# Patient Record
Sex: Female | Born: 1950 | Race: White | Hispanic: No | Marital: Married | Smoking: Never smoker
Health system: Southern US, Community
[De-identification: ages and names within clinical notes are randomized; demographics above are authoritative.]

## PROBLEM LIST (undated history)

## (undated) DIAGNOSIS — I10 Essential (primary) hypertension: Secondary | ICD-10-CM

## (undated) DIAGNOSIS — M1990 Unspecified osteoarthritis, unspecified site: Secondary | ICD-10-CM

## (undated) HISTORY — PX: REPLACEMENT TOTAL KNEE: SUR1224

## (undated) HISTORY — PX: UMBILICAL HERNIA REPAIR: SHX196

## (undated) HISTORY — DX: Essential (primary) hypertension: I10

## (undated) HISTORY — DX: Unspecified osteoarthritis, unspecified site: M19.90

## (undated) HISTORY — PX: TUBAL LIGATION: SHX77

## (undated) HISTORY — PX: APPENDECTOMY: SHX54

---

## 2007-07-15 ENCOUNTER — Inpatient Hospital Stay (HOSPITAL_COMMUNITY): Admission: RE | Admit: 2007-07-15 | Discharge: 2007-07-23 | Payer: Self-pay | Admitting: Orthopedic Surgery

## 2007-07-15 ENCOUNTER — Ambulatory Visit: Payer: Self-pay | Admitting: Cardiology

## 2007-07-15 ENCOUNTER — Encounter (INDEPENDENT_AMBULATORY_CARE_PROVIDER_SITE_OTHER): Payer: Self-pay | Admitting: Orthopedic Surgery

## 2007-07-26 ENCOUNTER — Ambulatory Visit: Payer: Self-pay | Admitting: Surgery

## 2007-07-26 ENCOUNTER — Inpatient Hospital Stay (HOSPITAL_COMMUNITY): Admission: EM | Admit: 2007-07-26 | Discharge: 2007-07-29 | Payer: Self-pay | Admitting: Emergency Medicine

## 2007-07-26 ENCOUNTER — Ambulatory Visit: Payer: Self-pay | Admitting: Cardiology

## 2007-07-26 ENCOUNTER — Ambulatory Visit: Payer: Self-pay | Admitting: Oncology

## 2007-08-02 ENCOUNTER — Ambulatory Visit: Payer: Self-pay | Admitting: Oncology

## 2007-08-09 DIAGNOSIS — D473 Essential (hemorrhagic) thrombocythemia: Secondary | ICD-10-CM | POA: Insufficient documentation

## 2007-08-15 ENCOUNTER — Ambulatory Visit: Payer: Self-pay | Admitting: Cardiology

## 2007-10-28 ENCOUNTER — Ambulatory Visit: Payer: Self-pay | Admitting: Cardiology

## 2011-02-03 NOTE — Cardiovascular Report (Signed)
NAMEARBIE, REISZ                ACCOUNT NO.:  [id number]   MEDICAL RECORD NO.:  [id number]          PATIENT TYPE:  INP   LOCATION:  4712                         FACILITY:  [facility]   PHYSICIAN:  Arturo Morton. Riley Kill, MD, FACCDATE OF BIRTH:  18-Apr-1951   DATE OF PROCEDURE:  DATE OF DISCHARGE:                            CARDIAC CATHETERIZATION   INDICATIONS:  The patient is a 60 year old who presented with chest pain  after total knee replacement.  Dr. Antoine Poche felt that she did not have a  pulmonary embolus. A Myoview was obtained and suggested anterior  ischemia.  Based on this, cardiac catheterization was recommended.   PROCEDURE:  1. Left heart catheterization.  2. Selective coronary arteriography..  3. Selective left ventriculography.   DESCRIPTION OF PROCEDURE:  The patient was brought to the  catheterization laboratory, prepped and draped in usual fashion. Through  an anterior puncture the right femoral artery was easily entered.  A 5-  French sheath was placed.  Views of left and right coronaries were  obtained in multiple angiographic projections.  Central aortic and left  ventricular pressures measured with pigtail. Ventriculography was then  done in the RAO projection.  Then reviewed the pictures with the  patient. I also reviewed the pictures with the patient's family.  There  were no complications.  She was taken to the holding area in  satisfactory clinical condition for sheath removal.   HEMODYNAMIC DATA:  1. Central aortic pressure 174/92, mean 130.  2. Left ventricular pressure 158/15.  3. No gradient pullback across aortic valve.   ANGIOGRAPHIC DATA:  1. Ventriculography was done in the RAO projection.  There was      preserved overall LV function with ejection fraction of 50-60%.      There is perhaps some minor inferobasal, hypokinesis but it is      difficult to tell due to the ventricular ectopy.  2. The left main is free of critical disease.  3. The left  anterior descending artery courses to the apex.  There are      septal, and a small diagonal branch. All of these appear to be      without significant calcification and no critical obstruction.  4. The circumflex provides a small first marginal, very small second      and third marginals, and large third and fourth marginal branches.      The circumflex throughout is smooth, large in caliber, and free of      critical disease.  The size of the circumflex appears to be almost      up to 5 mm.  5. The right coronary artery provides a posterior descending branch      and a small insignificant posterolateral system and is free of      critical disease.   CONCLUSIONS:  1. Preserved overall LV function  2. No critical coronary stenoses.   A D-dimer was obtained at beginning of case and is currently pending.  I  have spoken with Dr. Antoine Poche who thinks that the likelihood of  pulmonary embolus is quite low.  She probably  will be ready for  discharge in the morning.      Arturo Morton. Riley Kill, MD, [facility]  Electronically Signed     TDS/MEDQ  D:  07/20/2007  T:  07/21/2007  Job:  [id number]   cc:   Antony Madura, M.D.  Rollene Rotunda, MD, Karleen Hampshire, M.D.

## 2011-02-03 NOTE — Op Note (Signed)
NAMEYENI, JIGGETTS                ACCOUNT NO.:  [id number]   MEDICAL RECORD NO.:  [id number]          PATIENT TYPE:  INP   LOCATION:  2899                         FACILITY:  [facility]   PHYSICIAN:  Dyke Brackett, M.D.    DATE OF BIRTH:  1950/11/02   DATE OF PROCEDURE:  07/15/2007  DATE OF DISCHARGE:                               OPERATIVE REPORT   PREOPERATIVE DIAGNOSIS:  Severe osteoarthritis, right knee, with varus  deformity.   POSTOPERATIVE DIAGNOSIS:  Severe osteoarthritis, right knee, with varus  deformity.   OPERATION:  Right total knee replacement (LCS rotating platform,  standard femur, standard patella with size 2.5 tibia and 12.5-mm  rotating platform).   SURGEON:  Dyke Brackett, M.D.   ASSISTANT:  Legrand Pitts. Duffy, P.A.   TOURNIQUET TIME:  Approximately 1 hour 5 minutes.   DESCRIPTION OF PROCEDURE:  After sterile prep and drape, exsanguination  of the leg and placed in the 375, straight skin incision, medial  parapatellar approach to the knee, made with stripping of the medial  side due to a varus deformity.  The cut was using the slotted guide in  the appropriate amount of valgus, cutting about 2.5 mm below the most  diseased medial compartment with a 7-degree posterior slope on the  tibial; this was followed by sizing the femur, cutting the anterior and  posterior femoral cut, flexion gap measured at 12.5 mm with cutting of  the anterior-posterior femoral cut and then 4-degree valgus distal  femoral cut.  Chamfer cuts were made with stripping of some posterior  osteophytes and capsule.  The PCL was released.  Menisci were resected.  The trial femur was essentially anatomic.  This was followed by cutting  the patella, leaving 13 mm of native patella.  A 3-button patella was  used and again, the tibial prepared with a keel cut for the tibia,  preceded by sizing a 2.5 and then the trial components were inserted  with excellent stability, slight hyperextension, good  stability in  flexion, no varus or valgus instability and no tendency for bearing  spinout.  Balance was judged to be excellent.   The final components were certain in the tibia followed by femur with a  trial bearing; this was allowed to harden.  Excess cement was removed,  the tourniquet was released and the posterior aspect of the knee showed  no excessive bleeding.  Capsule closure was with #1 Ethibond, 2-0 Vicryl  and skin clips.  Marcaine with epinephrine was infiltrated in the skin.  A lightly compressive sterile dressing was applied.  She was taken to  the recovery room in stable condition.      Dyke Brackett, M.D.  Electronically Signed     WDC/MEDQ  D:  07/15/2007  T:  07/16/2007  Job:  [id number]

## 2011-02-03 NOTE — Consult Note (Signed)
Laura [name], Laura [name]                ACCOUNT NO.:  [id number]   MEDICAL RECORD NO.:  [id number]          PATIENT TYPE:  INP   LOCATION:  5032                         FACILITY:  [facility]   PHYSICIAN:  Samul Dada, M.D.DATE OF BIRTH:  01/19/1951   DATE OF CONSULTATION:  07/26/2007  DATE OF DISCHARGE:                                 CONSULTATION   REASON FOR CONSULTATION:  Thrombocytosis and leukocytosis.   REFERRING PHYSICIAN:  [facility].   HISTORY OF PRESENT ILLNESS:  Ms. Prestridge is a pleasant, 60 year old  white female, asked to see for evaluation of elevated platelet count and  leukocytosis.  She has a recent right total knee placement, complicated  with cardiac issues, including palpitations, and undergoing cardiac  catheterization on October 29.  Her thrombocytosis can be traced back to  prior to her operation.  However, her white count at that time was  normal, being 8.6 on October 22, to have increased later after operation  to 14.1.  It remained steadily in the range of 12 to 14 until October  30, at which time increased to 16.6 on October 31, and jumped to 23.6  today.  Her platelets initially on October 22 669, but steadily has been  increasing until October 31 where they show a value of 827,000, and  today at 1019.  Her peripheral smear shows large platelets, myeloid and  erythroid immaturity.  Her red blood cells do not suggest iron  deficiency.  At this point, no labs are available from her [facility] doctor's office, but according to her, has had regular  labs, most recently on August and September of this year, and according  to her, no indication of elevated platelet count or white blood cells  was addressed as a concern.  She now presents to the emergency  department with right knee pain, right lower extremity pain, which is  described as deep, localized, worse with ambulation.  She also complains  of chest pain on ambulation.  She is being  evaluated by cardiology  group.  She is currently on Lovenox since her discharge from the  hospital, and on Coumadin x1 on June 24, 2007.   PAST MEDICAL HISTORY:  1. History of small PE per CT angio on July 20, 2007; ejection      fraction 40%.  2. Hypertension.  3. Osteoarthritis.  4. DJD.  5. Asthma.   SURGERIES:  1. Status post right TKR July 15, 2007; Dr. Madelon Lips.  2. Status post umbilical hernia repair in 2000.  3. Status post benign bulbar tumor resection.  4. Status post cardiac catheterization July 20 2007; Dr. Antoine Poche.   ALLERGIES:  PERCOCET, NAPROXEN, MORPHINE SULFATE, DARVOCET.   CURRENT MEDICATIONS:  1. Lotensin 20 mg q.d.  2. Colace 100 mg b.i.d.  3. Lasix 40 mg b.i.d.  4. Lopressor 25 mg b.i.d.  5. Robaxin 300 mg q.6 hours p.r.n.  6. Dilaudid and lidocaine p.r.n.   Of note, at home she is on chromium picolinate, calcium and vitamin C.   REVIEW OF SYSTEMS:  Remarkable for mild dyspnea on exertion,  and hot  flashes.  Please refer to HPI for significant positives, the rest of the  review of systems is negative.   FAMILY HISTORY:  Mother alive, with history of cancer of the breast,  lymphoma, as well as cervical cancer, and multiple skin lesions.  She is  under the care of Dr. Melvyn Neth in [place].  Father died with kidney  disease, CHF.  She has two sisters alive and well and three brothers in  good health.   SOCIAL HISTORY:  The patient is married for 33 years, three children.  She works at [employer] as a Scientist, physiological.  Never smoked.  No  alcohol history.  Lives in [place].  Her last colonoscopy was at age  29, normal.  Her last mammogram was in January of 2008, normal.   PHYSICAL EXAMINATION:  GENERAL:  This is a well-developed, obese 60-year-  old white female in no acute distress, alert and oriented x3.  VITAL SIGNS: Blood pressure 177/88, pulse 78, respirations 16, afebrile.  HEENT:  Normocephalic, atraumatic.  PERRLA.  Oral mucosa  is without  thrush or lesions.  No areas of bleeding.  NECK:  Supple.  No cervical or supraclavicular masses.  LUNGS:  Clear to auscultation bilaterally.  No axillary masses.  BREASTS:  Not examined.  CARDIOVASCULAR:  Regular rate and rhythm with 1/6 systolic murmur.  No  rubs or gallops.  ABDOMEN:  Obese, nontender.  Bowel sounds x4.  No palpable spleen or  liver.  GI/RECTAL:  Deferred.  EXTREMITIES:  No clubbing or cyanosis.  Right lower extremity is  edematous, shows a hematoma, hemarthrosis at the surgery area.  Staples  are present.  No oozing.  SKIN:  As mentioned above, she is showing postoperative changes in the  right lower extremity, otherwise no lesions are visible on the left.  NEURO:  Nonfocal.   LABS:  Hemoglobin 10.3, hematocrit 30.8, white count 23.6, platelets  1019, MCV 89.5.  PT 13.1, PTT 42, INR 1.0.  Neutrophils 19.5,  lymphocytes 2.0, monocytes 1.6.  TSH 2.103.  Sodium 134, potassium 3.4,  BUN 13, creatinine 0.46, glucose 131.  Total bilirubin 0.5, alkaline  phosphatase 88, AST 31, ALT 53, total protein 7.0, albumin 4.0, calcium  9.8.   RADIOLOGICAL STUDIES:  As of October 30, a CT angio of the chest shows a  small PE at the right middle and right upper lobe.  No mass or lesions.   CT of the chest today is negative.   Dopplers are negative.   ASSESSMENT/PLAN:  Dr. Arline Asp has seen and evaluated the patient and  the chart has been reviewed.  This is a 60 year old white female, asked  to see for evaluation of thrombocytosis and leukocytosis.  There are two  issues to be addressed:   1. Elevated white count and elevated platelet count.  The patient was      not aware of any abnormalities in the CBC prior to October 22 when      her white count was 8.6 and platelets were 669,000.  She is      followed by Dr. Karin Lieu at [facility] in [place],      and has had regular labs as recently as August or September of this      year.  The peripheral  smear shows large platelets, myeloid and      erythroid immaturity, no blasts.  Red blood cells does not suggest      iron deficiency.  The  spleen on the CT scan on October 30 does not      look enlarged.  These abnormalities could be secondary to a      myeloproliferative disorder or could be reactive, related to recent      surgery, known hematoma and possible knee infection, since she had      turbid synovial fluid with 16,000 white blood cells, mostly polys,      and possible iron deficiency.  Will be easier and more efficient to      evaluate this problem later after acute issues resolved.  Will      check her JAK2 mutation.  2. Pulmonary emboli of the right middle lobe and right upper lobe on      her CT chest angiogram on October 30, apparently resolved in two      days.  Repeat CT chest angio, with the patient being on Lovenox      since October 30.  Her PT is 13.1, INR 1.0.  However, the patient      now has a large hematoma at the right knee area after total knee      replacement.  The patient is at risk for recurrent pulmonary emboli      in this setting, is very high at least for the next several weeks.      Would; therefore, favor a temporary IVC filter as an alternative to      Lovenox and Coumadin.   Thank you very much for allowing [place] the opportunity to participate in  the care of Ms. Janeece Agee.  Pending on the results of the labs ordered,  further recommendations will follow.      Marlowe Kays, P.A.      Samul Dada, M.D.  Electronically Signed    SW/MEDQ  D:  07/28/2007  T:  07/28/2007  Job:  [id number]   cc:   Dr. Daleen Squibb  Dr. Karin Lieu

## 2011-02-03 NOTE — Consult Note (Signed)
NAMECONSUELA, WIDENER                ACCOUNT NO.:  [id number]   MEDICAL RECORD NO.:  [id number]          PATIENT TYPE:  INP   LOCATION:  6731                         FACILITY:  [facility]   PHYSICIAN:  Thomas C. Wall, MD, FACCDATE OF BIRTH:  03/17/1951   DATE OF CONSULTATION:  07/26/2007  DATE OF DISCHARGE:                                 CONSULTATION   PRIMARY CARDIOLOGIST:  Rollene Rotunda, MD, [facility].   REQUESTING PHYSICIAN:  Dyke Brackett, M.D., orthopedics.   PATIENT PROFILE:  A 60 year old Caucasian female with recent chest pain  evaluation including abnormal IV Myoview, normal cath, abnormal CT  revealing right upper and middle lobe pulmonary emboli, who presents to  the ED with right knee pain and right knee hematoma, who we are asked to  consult on regarding anticoagulation.   PROBLEM:  1. Right middle lobe and right upper lobe small pulmonary emboli by CT      July 21, 2007.  Repeat CT July 26, 2007 showing no evidence      of pulmonary emboli reviewed with radiology.  2. Normal coronary arteries.      a.     July 18, 2007 2-D echocardiogram:  EF 55-60%, mild MR,       mildly dilated LA,  small pericardial effusion.      b.     July 19, 2007 adenosine Myoview:  EF 48% with anterior       and apical lateral ischemia.      c.     July 20, 2007 cardiac catheterization:  Normal coronary       arteries, EF 50-60%.  3. Hypertension.  4. Degenerative joint disease/osteoarthritis.  July 15, 2007,      status post right total knee replacement.  5. Asthma.  6. Status post umbilical hernia repair in 2000.  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   HISTORY OF PRESENT ILLNESS:  A 60 year old Caucasian female with recent  chest pain evaluation following right total knee arthroplasty.  Chest  pain evaluation consisted of an echocardiogram which was normal and  followed by an abnormal adenosine Myoview as outlined in the problem  list and then subsequently a normal  cardiac catheterization.  CT of the  chest was then performed on July 21, 2007 revealing a small right  middle and upper lobe pulmonary emboli.  She was subsequently placed on  Lovenox as well as Coumadin and discharged home this past Saturday,  July 23, 2007.  Apparently she was not provided with a Coumadin  prescription immediately on discharge.  She did not take Coumadin over  the weekend, but did continue her Lovenox bridging.  She was seen by  home health yesterday and reinitiated on Coumadin yesterday.  She woke  up early this morning with significant posterior right knee pain which  was concerning for her and she subsequently presented to the [facility]  ED.  She noted in the ED that she was also somewhat short of breath.  Repeat CT of the chest showed no evidence of pulmonary emboli and this  has been reviewed with  radiology.  Ultrasound of the right lower  extremity was negative for DVT, but did show hematoma at the right knee  surgical site.  She has been admitted by Dr. Candise Bowens service and we  have been asked to assist with anticoagulation.  Also of note, she has  undergone CBC showing platelets of greater than 1,000,000 and white  count of 23,000.  Hematology has been consulted as well.  She currently  offers no cardiac complaints.  Denies any chest pain or shortness of  breath.   ALLERGIES:  1. MORPHINE causes nausea.  2. She is also allergic to DARVOCET.   HOME MEDICATIONS:  1. Benazepril/HCTZ 20/25 mg daily.  2. Black cohosh daily.  3. Calcium daily.  4. Dilaudid p.r.n.  5. Docusate sodium 100 mg b.i.d.  6. Lasix 40 mg b.i.d.  7. Claritin 10 mg daily.  8. Methocarbamol p.r.n.  9. Lopressor 25 mg b.i.d.  10.Percocet p.r.n.   FAMILY HISTORY:  Mother is alive at age 47 with history diabetes,  hypertension, hyperlipidemia and cancer.  Father died at 58 and had an  MI in his 36s.  She has 2 sisters and 3 brothers, some of them have  hypertension, but  otherwise they are all alive and well.   SOCIAL HISTORY:  She lives in [place] with her husband.  She has 2  children and 3 grandchildren.  She has been married for 33 years.  She  denies any tobacco, alcohol or drug use.  Currently  not exercising.   REVIEW OF SYSTEMS:  Positive for shortness of breath this morning.  She  has right knee swelling and pain.  Otherwise all systems reviewed and  negative.   PHYSICAL EXAMINATION:  VITAL SIGNS:  Temperature 97.7, heart rate 78,  respirations 16, blood pressure 177/88, pulse ox 97% on room air.  GENERAL:  Pleasant white female in no acute distress, awake, alert and  oriented x 3.  NECK:  No bruits, no JVD.  LUNGS:  Respirations regular and unlabored.  Clear to auscultation.  CARDIAC:  Regular S1-S2.  No S3-S4.  There is a 2/6 apical systolic  murmur.  ABDOMEN:  Round, soft, nontender and nondistended.  Bowel sounds are  present x 4. EXTREMITIES:  Warm, dry and pink.  No clubbing or cyanosis.  There is swelling of the right knee as well as tenderness.   DIAGNOSTICS:  1. EKG shows sinus rhythm with PVCs.  2. Chest CT shows no PE and no acute findings.   LABORATORY DATA:  Hemoglobin 10.3, hematocrit 30.8, WBC 23.6, platelets  1,019, sodium 134, potassium 3.4, chloride 97, CO2 25, BUN 13,  creatinine 0.46, glucose 131, CK-MB 3.2, troponin-I less than 0,5,  calcium 96.   ASSESSMENT/PLAN:  1. Pulmonary emboli.  CT is reviewed with radiology.  There are      filling defects noted on July 21, 2007 which are now absent      suggestive of improvement.  In the setting of right knee hematoma,      thrombocytosis and leukocytosis with abnormal blood smear at this      point, would defer further decisions regarding anticoagulation to      hematology.  With  resolution of PE by CT, would have to strongly      consider placement of an IVC filter with avoidance of      anticoagulation.  We will be following along.  2. Thrombocytosis/leukocytosis  per hematology.  3. Degenerative joint disease/osteoarthritis status post right total  knee arthroplasty with right knee hematoma per orthopedics.      Nicolasa Ducking, ANP      Jesse Sans. Daleen Squibb, MD, [facility]  Electronically Signed    CB/MEDQ  D:  07/26/2007  T:  07/27/2007  Job:  [id number]

## 2011-02-03 NOTE — Assessment & Plan Note (Signed)
[facility]                            CARDIOLOGY OFFICE NOTE   NAME:Laura [name], Laura [name]                       MRN:          [id number]  DATE:10/28/2007                            DOB:          1951-05-10    PRIMARY CARE PHYSICIAN:  Dr. [name], [facility].   REASON FOR PRESENTATION:  Evaluate patient with pulmonary embolism.   HISTORY OF PRESENT ILLNESS:  The patient returns for followup.  Since I  last saw, her she has been doing well.  She has been followed by Dr.  Gilman Buttner for management of thrombocytosis which has been stable.  She is  having her Coumadin followed.  She is having no chest discomfort, neck  or arm discomfort.  She has had no palpitations, presyncope or syncope.  She has had no PND or orthopnea.  Starting is starting to walk a little  bit.  She rarely feels palpitations.   PAST MEDICAL HISTORY:  1. Chest pain with normal coronary arteries.  2. Pulmonary emboli.  3. Asymptomatic premature ventricular contractions.  4. Thrombocytosis.  5. Hypertension.  6. Degenerative joint disease.  7. Asthma.  8. Umbilical hernia repair.  9. Benign vulvar tumor, resected.  10.Total knee replacement.   ALLERGIES:  MORPHINE caused nausea, and she might be allergic DARVOCET.   MEDICATIONS:  1. Metoprolol 25 mg b.i.d.  2. Benazepril/hydrochlorothiazide 20/25 daily.  3. Calcium.  4. Furosemide 40 mg b.i.d.  5. Stool softener.  6. Loratadine.   REVIEW OF SYSTEMS:  As stated in the HPI, otherwise for  other systems.   PHYSICAL EXAMINATION:  GENERAL:  The patient is in no distress.  VITAL SIGNS:  Blood pressure 127/76, heart rate 87 and regular.  Weight  203 pounds, body mass index 39.  HEENT:  Eyelids unremarkable.  Pupils equal, round, reactive to light.  Fundi not visualized. Oral mucosa normal.  NECK:  No jugular distention at 45 degrees. Carotid upstroke brisk and  symmetrical.  No bruits, thyromegaly.  LYMPHATICS:   Unremarkable.  LUNGS:  Clear to auscultation bilaterally.  BACK:  No costovertebral angle tenderness.  CHEST:  Unremarkable.  HEART:  PMI not displaced or sustained. S1-S2 within normal limits. No  S3, no S4.  No clicks rubs, murmurs.  ABDOMEN:  Obese, positive bowel sounds normal in frequency pitch. No  bruits, rebound, guarding or midline pulsatile mass.  No organomegaly.  SKIN:  No rash, no nodules.  EXTREMITIES:  2+ pulse.  No edema.   ASSESSMENT/PLAN:  1. Pulmonary emboli.  The patient is on her Coumadin having this      followed.  I would continue this for 6 months unless her      hematologist suggests otherwise.  2. Essential thrombocytosis. The patient is being seen by Dr. Gilman Buttner.      This is apparently stable.  3. Hypertension.  Blood pressure is reasonable and should be even      better with weight loss.  4. Obesity. I discussed weight loss and exercise and hopefully she      will comply with this.  5. Premature ventricular  contractions.  These are not symptomatic.      She will continue with low-dose beta blocker.  6. Followup.  She can come back to this clinic as needed.     Rollene Rotunda, MD, [facility]  Electronically Signed    JH/MedQ  DD: 10/28/2007  DT: 10/30/2007  Job #: [id number]   cc:   Dr. Wynona Neat [facility]  Dellia Beckwith, M.D.

## 2011-02-03 NOTE — Discharge Summary (Signed)
NAMESHEROL, SABAS                ACCOUNT NO.:  [id number]   MEDICAL RECORD NO.:  [id number]          PATIENT TYPE:  INP   LOCATION:  5032                         FACILITY:  [facility]   PHYSICIAN:  Dyke Brackett, M.D.    DATE OF BIRTH:  08/14/1951   DATE OF ADMISSION:  07/26/2007  DATE OF DISCHARGE:  07/29/2007                               DISCHARGE SUMMARY   ADMISSION DIAGNOSIS:  Status post right total knee arthroplasty with  pain not relieved by narcotics.   DISCHARGE DIAGNOSES:  1. Status post right total knee arthroplasty with pain not relieved by      narcotics.  2. History of pulmonary embolus.  3. Hypertension.  4. Cardiomegaly.  5. Anemia.  6. Essential thrombocytopenia.   PROCEDURE:  Plication of vena cava with an umbrella.   HISTORY:  This is a 60 year old white female status post right total  knee arthroplasty on July 11, 2007.  She developed excruciating pain  in the right knee over the past 24 hours uncontrolled by oral  medications and narcotics.  She recently had a cardiac catheterization,  and also two small PEs were treated with Lovenox and Coumadin.  She has  not even increased her INR and was then just treated with Lovenox  therapeutic dose.  She was then with a white count upon admission of  23,600 with __________ , myelocytes, bands, and nucleated RBCs.  Platelet count was also 1,019,000.  She was admitted at this time for  right knee aspiration and consultation with cardiology and hematology  consults.   HOSPITAL COURSE:  A 60 year old female admitted July 26, 2007.  An  aspiration of her knee was obtained in the emergency room.  This  revealed a red turbid fluid with 16,000 white cells, 91% neutrophils, 7  lymphocytes, 2 monocytes, and no eosinophils.  There were no crystals  noted.  Gram stain revealed no growth.  There was no growth on culture  after 2 days.  This was felt to be on the basis of a bleed secondary to  her Coumadin.  Consultation  with cardiology as well as hematology were  made.  Blood was drawn for JAK2 mutation.  On the 5th, it was felt that  an IVC was indicated for the prevention of pulmonary emboli.  This was  completed by the interventional radiologists.  She was given 40 mEq of  KCl on the 5th in the morning and 20 at night.  On the 5th, she had  improvement and was weaned to oral pain medicines.  On the 5th, she did  have her umbrella placed.  She was then placed back on her CPM machine  from 0-60 degrees for 6-8 hours a day.  She was felt then to be stable  for discharge and was discharged on the 7th to return back to Dr.  Candise Bowens office for followup.   EKG of July 17, 2007 revealed sinus tachycardia with frequent PVCs.  Possible left atrial enlargement.  Left ventricular hypertrophy with  repolarization abnormality.  On July 26, 2007 revealed sinus rhythm  with frequent premature ventricular  complexes.  Right atrial enlargement  and left ventricular hypertrophy.  Radiographic studies on July 27, 2007 revealed a G2 IVC filter was deployed in the infrarenal IVC with  the tip at the lower L1 endplate.  Knee x-ray on July 26, 2007  revealed well-seated components of total knee prosthesis without  complicating factors.  CT of the chest was negative for PE.   LABORATORY STUDIES:  Admitted with hemoglobin 10.3, hematocrit 33.8%,  white count 23,600, platelet count 1,019,000.  Discharge hemoglobin 8.7,  hematocrit 25.3%, white count 14,600, and 774,000.  Admission  differential revealed 82 neutrophils, 9 lymphs, 7 monos, 1 eosinophil, 0  basophils.  Pro time on admission was 13.1, INR 1.0.  Admission sodium  134, potassium 3.4, chloride 97, CO2 25, glucose 131, BUN 13, creatinine  0.46.  GFR 60.  Total protein 6.5, albumin 3.3, AST 30, ALT 50, ALP 134,  and bilirubin was 1.0.  Discharge sodium 134, potassium 4.4, chloride  100, CO2 28, glucose 130, BUN 13, creatinine 0.59.  Gram stain was   negative.  Cultures showed no growth.  Fluid analysis was noted in the  H&P and history section.  Myoglobin was 192, MB was 3.2, troponin 0.05.  The JAK2 mutation was not on the chart at the time of this dictation.   DISCHARGE INSTRUCTIONS:  No restrictions in her diet.  She will add a  banana a day.  Keep her incision clean and dry.  Increase her activity  slowly.  Use her crutches.  May shower.  No driving or lifting for 6  weeks.  CPM 0-60 degrees for 6-8 hours per day.  Given medication  prescriptions for Dilaudid 2 mg 1-2 tablets every 4 hours as needed for  severe pain,  Percocet 5/325 1-2 tablets every 4 hours as needed for  pain.  Do not use Percocet and Dilaudid a the same time.  Follow back up  with Dr. Candise Bowens office for an appointment in about 2 weeks.  Dr.  Antoine Poche on August 15, 2007.  She was discharged in improved  condition.      Oris Drone Santiago Bumpers, P.A.-C.      Dyke Brackett, M.D.  Electronically Signed    BDP/MEDQ  D:  09/12/2007  T:  09/12/2007  Job:  [id number]

## 2011-02-03 NOTE — Consult Note (Signed)
NAMEKAILEY, Laura [name]                ACCOUNT NO.:  [id number]   MEDICAL RECORD NO.:  [id number]          PATIENT TYPE:  INP   LOCATION:  5024                         FACILITY:  [facility]   PHYSICIAN:  Rollene Rotunda, MD, FACCDATE OF BIRTH:  [birth year]   DATE OF CONSULTATION:  07/17/2007  DATE OF DISCHARGE:                                 CONSULTATION   REASON FOR CONSULTATION:  Patient with palpitations and chest pain.   HISTORY OF PRESENT ILLNESS:  The patient is very pleasant 60 year old  white female with past history of chest discomfort some years ago.  She  reports a negative stress test.  She is now status post right total knee  replacement.  The operation was  apparently uncomplicated.  She was  actually sent probably to go home tomorrow.  However, this morning she  was noted to have some irregular heart rhythm with some palpitations.  She was not particularly noticing this at that time, though she is now  feeling these.  She says she has not noticed these at  home.  She never  had any presyncope or syncope.  She feels her heart skipping.  The EKG  did demonstrate sinus tachycardia with frequent premature ectopic  complexes, borderline QT prolongation, voltage criteria for left  ventricle hypertrophy, no acute ST-T wave changes.   This afternoon the patient did get some chest tightness.  She describes  this as gripping.  It was moderate.  She does not feel that now, but she  still feeling the palpitations.  She says this is similar to what she  had years ago when she had her stress test.  However, she never gets  this at home.  Despite her knee problem, she has been active.  She is  able to climb stairs and do her household chores without limitations and  without chest discomfort, neck or arm discomfort.  She does not have any  shortness of breath and denies any PND or orthopnea.   PAST MEDICAL HISTORY:  1. Hypertension.  2. Osteoarthritis.  3. Degenerative joint disease.  4.  Asthma.   PAST SURGICAL HISTORY:  1. Right knee replacement.  2. Umbilical hernia repair in 2000.  3. Benign vulvar tumor resected.   ALLERGIES:  PERCOCET, DARVOCET and MORPHINE cause upset stomach.   MEDICATIONS:  (Prior to admission),  1. Benazepril HCT 20/25.  2. Lasix 40 mg daily.  3. Ultram.  4. Chromium.  5. Calcium.  6. Vitamin C  7. Claritin.  8. Black Cohosh.   SOCIAL HISTORY:  The patient has been married for 33 years, for which  she has 2 children and 3 grandchildren.  She has never smoked  cigarettes.  She does drink alcohol.   FAMILY HISTORY:  Contributory for her father having myocardial  infarction in his 62s.   REVIEW OF SYSTEMS:  This is as stated in the HPI, otherwise positive for  hot flashes.  Negative for all other systems.   PHYSICAL EXAMINATION:  The patient is in no distress.  Blood pressure  150/70, heart rate 90 and irregular, afebrile, 95% saturation on  room  air.  HEENT:  Eyes are unremarkable.  Pupils equal, round and reactive to  light.  Fundi not visualized.  Oral mucosa normal.  NECK:  No jugular venous distention at 45 degrees.  Carotid upstroke  brisk and symmetric.  Soft left carotid bruit, no thyromegaly.  LYMPHATICS:  No cervical, axillary, or inguinal adenopathy.  LUNGS:  Clear to auscultation bilaterally.  BACK:  No costovertebral angle tenderness.  CHEST:  Unremarkable.  HEART:  PMI not displaced or sustained,  S1 and S2 within normal limits,  no S3, no S4.  A 2/6 early peaking, brief apical systolic murmur.  No  diastolic murmurs.  ABDOMEN:  Obese, positive bowel sounds; normal frequency and pitch.  No  bruits, rebound, guarding or midline pulsatile mass.  No  hepatosplenomegaly.  SKIN:  No rashes, no __________  EXTREMITIES:  There were 2+ pulses throughout.  No cyanosis or clubbing.  Trace bilateral lower extremity edema.  NEUROLOGIC:  Oriented to person, place and time.  Cranial nerves II-XII  grossly intact.  Motor grossly  intact.   EKG:  As above.   LABS:  Troponin 0.05, CK 433, with an MB of 2.9.  Sodium 134, potassium  3.6, BUN 8, creatinine 0.69.  CBC:  WBC 13.1, hemoglobin 10.3, platelets  544.   ASSESSMENT/PLAN:  1. PALPITATIONS.  The patient has had an increase in ectopy.  She      apparently has had some of this prior to surgery, though she does      not report palpitations.  But at this point she is quite irregular.      As I listed, I am going to put her on a monitor.  Will check a      thyroid profile.  Her electrolytes are otherwise fine.  I am going      to start a very low dose of beta blocker if her blood pressure      tolerates.  Currently her blood pressure is elevated.  2. CHEST DISCOMFORT.  The patient also had some chest discomfort.      This is somewhat atypical.  There are no acute findings on the EKG      suggestive of ischemia.  I will rule out myocardial infarction and      put her on therapeutic dose of Lovenox, if okay with surgery.  Will      also get an echocardiogram in the a.m.  3. HYPERTENSION.  She has been restarted on her ACE inhibitor.  She      was  slightly actually hypotensive this morning, but this has      resolved.     Rollene Rotunda, MD, [facility]  Electronically Signed    JH/MEDQ  D:  07/17/2007  T:  07/18/2007  Job:  [id number]

## 2011-02-03 NOTE — Assessment & Plan Note (Signed)
[facility]                            CARDIOLOGY OFFICE NOTE   NAME:[name], [name]                       MRN:          [id number]  DATE:08/15/2007                            DOB:          1950-10-01    The primary is Laura [name] of [facility].   REASON FOR PRESENTATION:  Evaluate patient with recent pulmonary  embolism.   HISTORY OF PRESENT ILLNESS:  I saw this patient on November 4 in  consultation.  She was status post right knee surgery.  He had  palpitations and chest pain.  We did send her for a cardiac  catheterization, which demonstrated normal coronaries.  The EF was 55-  60%.  Subsequent spiral CT demonstrated bilateral pulmonary emboli.  She  was started on Coumadin and discharged.  However, she returned just a  day later with hemorrhage in her knee.  She was taken off of the  Coumadin and Lovenox which she had been put on and had a removable  inferior vena cava filter placed.  She was also noted to have  thrombocytosis at that visit and was seen by oncology, who wanted to  follow her but did not suggest a specific therapy.   The patient returns now.  She has been off the Coumadin.  She is  starting to get less pain in her knee.  Dr. Madelon Lips has signed off and  suggests that she would be able to tolerate the Coumadin if it was still  indicated.  She has had no chest discomfort.  She is rarely feeling  palpitations.  She has had no shortness of breath.  Denies any PND or  orthopnea.  She is doing some physical therapy on her knee and walking  but is coming slowly.   PAST MEDICAL HISTORY:  1. Pulmonary emboli.  2. Hypertension.  3. Degenerative joint disease, status post right total knee      replacement.  4. Asthma.  5. Umbilical hernia repair.  6. Benign vulvar tumor resected.   ALLERGIES:  MORPHINE caused nausea, and she might be allergic to  DARVOCET.   MEDICATIONS:  1. Metoprolol 25 mg b.i.d.  2. Furosemide 20  mg daily.  3. Benazepril/HCT 20/25 mg daily.  4. Stool softener.   REVIEW OF SYSTEMS:  As stated in the HPI and otherwise negative for  other systems.   PHYSICAL EXAMINATION:  The patient is in no distress.  Blood pressure 142/78, rate 101 and regular.  Weight 209 pounds.  HEENT:  Eyelids unremarkable.  Pupils equal, round, and reactive to  light.  Fundi not visualized.  Oral mucosa unremarkable.  NECK:  No jugular venous distention at 45 degrees.  Carotid upstroke  brisk and symmetric, no bruits, no thyromegaly.  LYMPHATIC:  No cervical, axillary or inguinal adenopathy.  LUNGS:  Clear to auscultation bilaterally.  BACK:  No costovertebral angle tenderness.  CHEST:  Unremarkable.  HEART:  PMI not displaced or sustained, S1 and S2 within normal limits,  no S3, no S4, no clicks, no rubs, no murmurs.  ABDOMEN:  Obese.  Positive bowel sounds, normal  in frequency and pitch.  No bruits, no rebound, no guarding, no midline pulsatile mass.  No  hepatomegaly, no splenomegaly.  SKIN:  No rashes, no nodules.  EXTREMITIES:  2+ pulses, no edema.  Her right knee is still tender and  swollen but much less so than previous.  NEUROLOGIC:  Grossly intact.   EKG:  Sinus rhythm, premature ventricular contractions, axis within  normal limits, intervals within normal limits.  No acute ST-T wave  changes.   ASSESSMENT AND PLAN:  1. Pulmonary emboli.  The patient did have pulmonary emboli.  At this      point I think she still would benefit from the Coumadin.  Dr.      Madelon Lips has suggested this would be okay from an orthopedic      standpoint.  I called Dr. Gilman Buttner of [facility],      to make sure there were no contraindications with her essential      thrombocytosis.  I talked with Dr. Sherral [name] today, who will follow      her Coumadin.  Therefore, I think it is safe to continue the      Coumadin.  She will start on 5 mg a day and she has been given      instructions to get this  checked Wednesday at Dr. Sherral [name]' office      and will be followed by Dr. Sherral [name] as stated above.  If she has      any problems at all with her knee swelling or other symptoms, she      needs to discontinue this.  Of note, today I did call also the      interventional radiologist who put in the inferior vena cava      filter.  It is a removable filter and can come out at any point.  I      will plan on taking this out probably after adequate      anticoagulation.  We will probably continue the Coumadin for about      3 months.  2. Essential thrombocytosis.  The patient is going to be seen by Dr.      Gilman Buttner.  Again, we spoke about the case today.  3. Hypertension.  Blood pressure is slightly elevated.  We will adjust      this through therapeutic lifestyle changes with weight loss.  4. Asthma.  She is not having any symptoms related to this.  No      further testing or therapy is indicated.  5. PVCs.  These are not particularly symptomatic.  No further therapy      is warranted.  6. Follow-up.  We will see her back in about 3 months.     Rollene Rotunda, MD, [facility]  Electronically Signed   JH/MedQ  DD: 08/15/2007  DT: 08/16/2007  Job #: [id number]   cc:   Dr. Sherral [name]

## 2011-02-06 NOTE — Discharge Summary (Signed)
NAMEBRYCE, Laura [name]                ACCOUNT NO.:  [id number]   MEDICAL RECORD NO.:  [id number]          PATIENT TYPE:  INP   LOCATION:  4712                         FACILITY:  [facility]   PHYSICIAN:  Dyke Brackett, M.D.    DATE OF BIRTH:  1951/02/07   DATE OF ADMISSION:  07/15/2007  DATE OF DISCHARGE:  07/23/2007                               DISCHARGE SUMMARY   ADMISSION DIAGNOSES:  1. End-stage osteoarthritis - right knee.  2. Hypertension.  3. Asthma.  4. History of ectopic pregnancy.   DISCHARGE DIAGNOSES:  1. End-stage osteoarthritis - right knee, status post right total knee      arthroplasty.  2. Acute blood loss anemia secondary to surgery.  3. Cardiac ectopy.  4. Small pulmonary emboli.  5. Hypokalemia, now resolved.  6. Hypertension.  7. Asthma.  8. History of ectopic pregnancy.   SURGICAL PROCEDURES:  On July 15, 2007, Ms. Laura [name] underwent a right  total knee arthroplasty done by Dr. Marcie Mowers assisted by Arnoldo Morale, PA-C.  She had an LCS complete metal back patella, cemented, size  standard placed with an LCS complete primary femoral component,  cemented, size standard - right and a DePuy MBT keeled tibial tray,  cemented, size 2.5 and an LCS complete RP insert, size standard, 12.5-mm  thickness.   COMPLICATIONS:  None.   CONSULTS:  1. Physical therapy consult July 16, 2007.  2. Occupational therapy consult July 17, 2007.  3. [facility] consult July 17, 2007.  4. Pharmacy consult for Lovenox therapy July 17, 2007 with addition      of a case management consult.   HISTORY OF PRESENT ILLNESS:  This 60 year old white female patient  presented to Dr. Madelon [name] with a longstanding history of right knee pain.  She has had problems with a meniscal tear in her knee but also has  extensive osteoarthritis.  She is having pain with all activities and is  requiring narcotics for control.  Because of this, she is presenting for  a right knee  replacement.   HOSPITAL COURSE:  Ms. Laura [name] tolerated her surgical procedure well  without immediate postoperative complications.  She was transferred to  5000.  Postop day #1 she was afebrile and vitals stable, hemoglobin 10  and hematocrit 29.1.  The leg was neurovascularly intact.  She was  weaned off her oxygen and started on therapy per protocol.   Postop day #2 T-max 100.3 and vitals stable, hemoglobin 10.3, hematocrit  29.9 and white count 13.1.  The dressing was changed to the right knee.  The leg was neurovascularly intact.  She was noted, however, to have a  bit of an irregular heart rate with her blood pressure a little bit low,  so an EKG and cardiac enzymes were ordered.  It did show just some  ectopy and that was monitored.  She was switched to p.o. pain meds, but  with the frequent PVCs on the EKG, a cardiology consult was obtained.  They followed her throughout her hospitalization.  Because of the  ectopy, they felt she required telemetry and  was transferred to  telemetry later that day.   The patient started having some episodes of chest pain during the night  with an elevated blood pressure.  Cardiology did adjust her meds.  Cardiac enzymes were negative.  She was having some difficulty with  constipation and that was treated with laxatives.  Because of the chest  pain and palpitations, cardiology did start her on therapeutic Lovenox.   On July 18, 2007, she continued to have some chest pain and PVCs, and  her meds were adjusted.  She was doing well with therapy, and plans were  made for hopeful  DC the next day.  On July 19, 2007, she was doing  well from an orthopedic standpoint, but a stress Myoview scan which was  done showed some signs of angina, and they recommended a cardiac cath.  Plans were made for that the next day.  The cardiac cath, however, did  show some ischemia.  They felt a PE was not likely, but a chest CT was  ordered.  Orthopedically, she  remained stable at that time and doing  well with therapy.  A chest CT did come back showing a small PE in the  right mid and upper lobe.  She was started on Coumadin accordingly.  She  was still on her treatment dose Lovenox at that time.  She had a little  bit of shortness of breath on July 21, 2007 but otherwise was  asymptomatic.   On July 22, 2007, her ectopy had improved.  Cardiology felt DC from  telemetry was warranted.  She remained afebrile and was doing well with  therapy.  She continued to make good progress over the next several  days, and her Coumadin level did rise accordingly, and it was felt she  was ready for DC home on July 23, 2007.   DISCHARGE INSTRUCTIONS:  1. Diet:  She is to resume her regular prehospitalization diet.  2. Medications:  She may resume her prehospitalization meds except no      Ultram at this time while on other pain meds.  Home meds included:      a.     Benazepril/hydrochlorothiazide 20/25 mg 1 tablet p.o. q.a.m.      b.     Lasix 40 mg 2 tablets p.o. q.h.s.      c.     Chromium 80 mg 1 tablet p.o. q.a.m.      d.     Calcium plus D 600/400 mg 1 tablet p.o. q.a.m.      e.     Vitamin C 500 mg p.o. q.a.m.      f.     Claritin 10 mg 1 tablet p.o. q.a.m.      g.     Potassium 1 tablet p.o. q.a.m.      h.     Black cohosh root one 3 times a day.  3. Additional meds at this time include      a.     Norco 5/325 mg 1-2 tablets p.o. q.4 h. p.r.n. for pain, #60       with no refill.      b.     Dilaudid 2 mg 1 tablet p.o. q.4 h. p.r.n. for breakthrough       pain, #30 with no refill.      c.     Robaxin 500 mg 1-2 tablets p.o. q.6 h. p.r.n. for spasms,       #60 with no  refill.      d.     Coumadin - She is to take as directed by the home health       pharmacy for probably 3-6 months, and cardiology will monitor the       Coumadin long term.      e.     Lovenox 100 mg subcu b.i.d. until Coumadin therapeutic.  4. Activity:  She can be out of  bed weightbearing as tolerated on the      right leg with the use of a walker.  She is to have home CPM 0-90      degrees 6-8 hours a day and home health PT per [facility].  Please see the blue total knee discharge sheet for further      activity instructions.  5. Wound care:  She may shower after no drainage from the wound for 2      days.  Please see the blue total knee discharge sheet for further      wound care instructions.  6. Followup      a.     She needs to follow up with Dr. Madelon [name] in our office on       Thursday, July 28, 2007, and needs to call [phone] for that       appointment.      b.     She needs to follow up with Dr. Antoine Poche at about that same       time and needs to call [phone] for that appointment.   LABORATORY DATA:  A myocardial perfusion scan done on July 19, 2007  showed an area of reversibility along the anterior and apical lateral  wall which was concerning for an area of ischemia.  The ejection  fraction is normal, measuring 48%, without gross wall motion  abnormalities.   Hemoglobin/hematocrit ranged from 13.9 and 40.4 on July 13, 2007 and  went down to 9.8 and 28.9 on July 20, 2007 and was 9.6 and 28.3 on  July 22, 2007.  White count went from 8.6 on July 13, 2007 to 16.6  on July 22, 2007.  Platelets went from 669,000 on July 13, 2007 to  515,000 on July 18, 2007 to 827,000 on July 22, 2007.   D dimer on July 20, 2007 was high at 1.99.  PT and INR on July 23, 2007 were 14.4 and 1.1.   Sodium dropped to a low of 134 on July 17, 2007 and potassium to a  low of 3.2 on July 13, 2007.  Glucose ranged from 109 on July 13, 2007 to 123 on July 17, 2007.  Potassium then also went to a low of  3.1 on July 20, 2007 and was 3.7 on July 21, 2007 and chloride to  a low of 95 on July 20, 2007.   ALT was high on July 13, 2007 at 75.  CK was high July 17, 2007 at  433 and then  went to 393 and 301.  Troponin increased to 0.07 on July 17, 2007 at 7:38 and then was within normal limits.  All other  laboratory studies were within normal limits.      Legrand Pitts Duffy, Arnetha Courser, M.D.  Electronically Signed    KED/MEDQ  D:  08/23/2007  T:  08/23/2007  Job:  [id number]   cc:   Rollene Rotunda, MD, [facility]

## 2011-06-30 LAB — BASIC METABOLIC PANEL
BUN: 13
BUN: 13
BUN: 14
CO2: 27
Calcium: 9.6
Chloride: 100
Chloride: 99
Creatinine, Ser: 0.46
Creatinine, Ser: 0.51
GFR calc non Af Amer: >60
GFR calc non Af Amer: >60
Glucose, Bld: 117 — ABNORMAL HIGH
Glucose, Bld: 130 — ABNORMAL HIGH
Glucose, Bld: 131 — ABNORMAL HIGH
Potassium: 3.4 — ABNORMAL LOW
Potassium: 4
Potassium: 4.4
Sodium: 134 — ABNORMAL LOW
Sodium: 134 — ABNORMAL LOW

## 2011-06-30 LAB — DIFFERENTIAL
Basophils Absolute: 0.1
Basophils Relative: 1
Eosinophils Absolute: 0.3
Eosinophils Relative: 1
Eosinophils Relative: 2
Lymphocytes Relative: 11 — ABNORMAL LOW
Lymphocytes Relative: 9 — ABNORMAL LOW
Lymphs Abs: 1.7
Lymphs Abs: 2.2
Lymphs Abs: 2.3
Monocytes Absolute: 1.2 — ABNORMAL HIGH
Monocytes Relative: 7
Monocytes Relative: 9
Neutro Abs: 14.5 — ABNORMAL HIGH

## 2011-06-30 LAB — CBC
HCT: 25.3 — ABNORMAL LOW
HCT: 28.3 — ABNORMAL LOW
HCT: 30.8 — ABNORMAL LOW
Hemoglobin: 10.3 — ABNORMAL LOW
Hemoglobin: 8.7 — ABNORMAL LOW
MCHC: 33.5
MCV: 88.3
Platelets: 1019
Platelets: 774 — ABNORMAL HIGH
Platelets: 869 — ABNORMAL HIGH
RBC: 3.45 — ABNORMAL LOW
RDW: 14.3 — ABNORMAL HIGH
RDW: 14.7 — ABNORMAL HIGH
WBC: 18.9 — ABNORMAL HIGH

## 2011-06-30 LAB — SYNOVIAL CELL COUNT + DIFF, W/ CRYSTALS
Crystals, Fluid: NONE SEEN
Lymphocytes-Synovial Fld: 7
Neutrophil, Synovial: 91 — ABNORMAL HIGH
WBC, Synovial: 16000 — ABNORMAL HIGH

## 2011-06-30 LAB — GRAM STAIN

## 2011-06-30 LAB — COMPREHENSIVE METABOLIC PANEL
ALT: 50 — ABNORMAL HIGH
AST: 30
CO2: 29
Calcium: 9.2
Chloride: 96
Creatinine, Ser: 0.53
Potassium: 3.3 — ABNORMAL LOW
Sodium: 133 — ABNORMAL LOW
Total Protein: 6.5

## 2011-06-30 LAB — POCT CARDIAC MARKERS: Operator id: 234501

## 2011-06-30 LAB — PROTIME-INR
INR: 1
Prothrombin Time: 13.5

## 2011-07-01 LAB — CK TOTAL AND CKMB (NOT AT ARMC)
CK, MB: 2.4
Relative Index: 0.7
Relative Index: 0.8
Total CK: 301 — ABNORMAL HIGH
Total CK: 393 — ABNORMAL HIGH

## 2011-07-01 LAB — DIFFERENTIAL
Eosinophils Relative: 3
Lymphocytes Relative: 32
Lymphs Abs: 2.7
Monocytes Absolute: 0.8 — ABNORMAL HIGH
Monocytes Relative: 10
Neutro Abs: 4.7

## 2011-07-01 LAB — CBC
HCT: 28.3 — ABNORMAL LOW
HCT: 28.7 — ABNORMAL LOW
HCT: 29.9 — ABNORMAL LOW
HCT: 40.4
Hemoglobin: 10 — ABNORMAL LOW
Hemoglobin: 10.5 — ABNORMAL LOW
Hemoglobin: 9.6 — ABNORMAL LOW
MCHC: 34
MCHC: 34.1
MCHC: 34.2
MCV: 87.5
MCV: 87.6
MCV: 88.6
MCV: 89.1
Platelets: 575 — ABNORMAL HIGH
Platelets: 669 — ABNORMAL HIGH
Platelets: 700 — ABNORMAL HIGH
RBC: 3.18 — ABNORMAL LOW
RBC: 3.24 — ABNORMAL LOW
RBC: 3.42 — ABNORMAL LOW
RDW: 13.3
RDW: 13.3
WBC: 12.3 — ABNORMAL HIGH
WBC: 13.1 — ABNORMAL HIGH
WBC: 14 — ABNORMAL HIGH
WBC: 14.1 — ABNORMAL HIGH
WBC: 16.6 — ABNORMAL HIGH
WBC: 8.6

## 2011-07-01 LAB — URINALYSIS, ROUTINE W REFLEX MICROSCOPIC
Hgb urine dipstick: NEGATIVE
Nitrite: NEGATIVE
Protein, ur: NEGATIVE
Urobilinogen, UA: 0.2

## 2011-07-01 LAB — CROSSMATCH
ABO/RH(D): O POS
ABO/RH(D): O POS
Antibody Screen: NEGATIVE
Antibody Screen: NEGATIVE

## 2011-07-01 LAB — URINE CULTURE

## 2011-07-01 LAB — BASIC METABOLIC PANEL
BUN: 13
BUN: 6
CO2: 30
Calcium: 8.9
Calcium: 9.2
Chloride: 96
Chloride: 96
Creatinine, Ser: 0.5
Creatinine, Ser: 0.56
Creatinine, Ser: 0.72
GFR calc Af Amer: >60
GFR calc Af Amer: >60
GFR calc Af Amer: >60
GFR calc non Af Amer: >60
GFR calc non Af Amer: >60
GFR calc non Af Amer: >60
GFR calc non Af Amer: >60
Potassium: 3.5
Potassium: 3.6
Potassium: 3.7
Sodium: 136
Sodium: 136
Sodium: 138

## 2011-07-01 LAB — APTT
aPTT: 33
aPTT: 42 — ABNORMAL HIGH

## 2011-07-01 LAB — ABO/RH: ABO/RH(D): O POS

## 2011-07-01 LAB — COMPREHENSIVE METABOLIC PANEL
AST: 31
Albumin: 4
BUN: 14
Creatinine, Ser: 0.54
GFR calc Af Amer: >60
Total Protein: 7

## 2011-07-01 LAB — D-DIMER, QUANTITATIVE: D-Dimer, Quant: 1.99 — ABNORMAL HIGH

## 2011-07-01 LAB — URINE MICROSCOPIC-ADD ON

## 2011-07-01 LAB — TROPONIN I
Troponin I: 0.05
Troponin I: 0.06
Troponin I: 0.07 — ABNORMAL HIGH

## 2011-07-01 LAB — MAGNESIUM: Magnesium: 2.2

## 2017-07-07 DIAGNOSIS — D473 Essential (hemorrhagic) thrombocythemia: Secondary | ICD-10-CM | POA: Diagnosis not present

## 2017-12-02 DIAGNOSIS — D473 Essential (hemorrhagic) thrombocythemia: Secondary | ICD-10-CM

## 2018-07-04 DIAGNOSIS — D649 Anemia, unspecified: Secondary | ICD-10-CM

## 2018-07-04 DIAGNOSIS — D473 Essential (hemorrhagic) thrombocythemia: Secondary | ICD-10-CM

## 2018-12-19 DIAGNOSIS — D473 Essential (hemorrhagic) thrombocythemia: Secondary | ICD-10-CM

## 2019-04-12 DIAGNOSIS — D473 Essential (hemorrhagic) thrombocythemia: Secondary | ICD-10-CM | POA: Diagnosis not present

## 2020-02-05 DIAGNOSIS — D473 Essential (hemorrhagic) thrombocythemia: Secondary | ICD-10-CM

## 2020-04-24 DIAGNOSIS — C50211 Malignant neoplasm of upper-inner quadrant of right female breast: Secondary | ICD-10-CM | POA: Insufficient documentation

## 2020-04-24 DIAGNOSIS — C50911 Malignant neoplasm of unspecified site of right female breast: Secondary | ICD-10-CM | POA: Insufficient documentation

## 2020-04-25 ENCOUNTER — Other Ambulatory Visit: Payer: Self-pay | Admitting: Family Medicine

## 2020-04-25 DIAGNOSIS — R928 Other abnormal and inconclusive findings on diagnostic imaging of breast: Secondary | ICD-10-CM

## 2020-05-01 ENCOUNTER — Ambulatory Visit
Admission: RE | Admit: 2020-05-01 | Discharge: 2020-05-01 | Disposition: A | Discharge status: Home / Self Care | Payer: Medicare HMO | Source: Ambulatory Visit | Attending: Family Medicine | Admitting: Family Medicine

## 2020-05-01 ENCOUNTER — Other Ambulatory Visit: Payer: Self-pay | Admitting: Family Medicine

## 2020-05-01 ENCOUNTER — Other Ambulatory Visit: Payer: Self-pay

## 2020-05-01 DIAGNOSIS — R928 Other abnormal and inconclusive findings on diagnostic imaging of breast: Secondary | ICD-10-CM

## 2020-05-02 DIAGNOSIS — C50919 Malignant neoplasm of unspecified site of unspecified female breast: Secondary | ICD-10-CM

## 2020-05-02 HISTORY — DX: Malignant neoplasm of unspecified site of unspecified female breast: C50.919

## 2020-07-25 ENCOUNTER — Other Ambulatory Visit: Payer: Self-pay | Admitting: Hematology and Oncology

## 2020-07-25 DIAGNOSIS — D473 Essential (hemorrhagic) thrombocythemia: Secondary | ICD-10-CM

## 2020-08-01 ENCOUNTER — Telehealth: Payer: Self-pay

## 2020-08-01 NOTE — Telephone Encounter (Signed)
-----   Message from Derwood Kaplan, MD sent at 07/31/2020  7:12 PM EST ----- Regarding: call pt. Tell her DEXA is normal

## 2020-08-01 NOTE — Telephone Encounter (Signed)
Patient informed of DEXA scan

## 2020-08-05 NOTE — Progress Notes (Signed)
[name]  [address] [place],  [place]  [address] [phone]  Clinic Day:  08/07/2020  Referring physician: No ref. provider found   This document serves as a record of services personally performed by Hosie Poisson, MD. It was created on their behalf by Curry,Lauren E, a trained medical scribe. The creation of this record is based on the scribe's personal observations and the provider's statements to them.   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  She was occasionally treated with pulse busulfan, but then was lost to follow-up in 2011. She was referred back in March 2014, when her platelet count reached 1.4 million, and she has been on hydroxyurea since that time.  She had been taking hydroxyurea a 1000 mg daily.  She was lost to follow-up again from May 2017 to July 2018. She continued on hydroxyurea during that time.  She was on 1000 mg daily alternating with 1500 mg daily when she was seen in October.  At that time the platelet count was down to 337,000, so we recommended decreasing the hydroxyurea to a 1000 mg daily.  Her platelet count was up to 533,000 in January 2019 at Dr. Unk Lightning office, so the hydroxyurea was increased to 1500 mg daily.  The platelets remained in that range at her visit here in February, so we kept her on 1500 mg daily.  In March, her platelet count was 316,000 with a normal white count and hemoglobin, so we continued the same dose of hydroxyurea  She has had mild renal insufficiency and has had intermittent hypokalemia, for which she was taking potassium chloride 20 mEq 3 times daily.  She has had chronic elevation of the liver transaminases since 2014, felt to be  most likely due to medications.  When she was seen for routine follow-up in October 2019 she had anemia and her platelet count had dropped to 154,000.  Hydroxyurea was placed on hold and we followed her CBC closely.  The anemia slowly resolved and platelet count slowly increased and was up to 568,000 in January 2020.  At that time, we resumed hydroxyurea 500 mg daily.  In February, her platelets remained elevated at 743,000, so hydroxyurea was increased to 1000 mg.  She has done well since that time with her platelets running under 500,000. When she was seen in July, she still had mild hypokalemia despite potassium chloride 20 mEq 3 times a day, so we recommended she increase that to 4 times daily.  She reported contracting COVID-19 back in December 2020.    Annual screening bilateral mammogram from May 13th 2021 revealed a possible mass and an area of calcifications of the right breast.  No findings suspicious of malignancy were found within the left breast.  Right diagnostic mammogram and ultrasound were finally done on August 5th and revealed a 0.9 cm mass in the medial right breast, as well as an indeterminate group of coarse heterogeneous calcifications measuring 0.6 cm in the upper outer right breast.  She underwent biopsy of these areas.  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  Estrogen and progesterone receptors were highly positive at 100%, and HER2/neu  negative, with a Ki 67 of 1%.  The biopsy of the area of calcifications revealed fibro-adenomatoid changes with calcifications.  She, therefore, has a clinical stage IA (T1b N0 M0) hormone receptor positive right breast cancer.  Dr. Lilia Pro performed right breast lumpectomy and sentinel lymph nodes biopsy on September 13th.  Surgical pathology from this procedure confirmed invasive ductal carcinoma, grade 1, and low grade ductal carcinoma in situ, measuring a total of 9  mm.  All margins were negative for malignancy and 1 lymph node was negative for a T1b N0 M0, stage I A.    Due to her family history of malignancy, we previously discussed genetic testing, but she had declined, as her sister's testing was negative.  Due to her family history of colon cancer, she has been undergoing colonoscopy at least every 5 years.  She is due for colonoscopy at this time.  The patient's mother had endometrial cancer at age 85 and breast cancer at age 40, then developed non-Hodgkin's lymphoma at age 46. Her maternal grandmother had breast cancer at age 37. Her maternal grandfather had lung cancer at unknown age.  Her sister had a history of colon cancer at age 57 and then developed a new primary breast cancer at age 33.  Unfortunately, her sister died of metastatic breast cancer.  She states that her sister underwent genetic testing which was apparently negative, but she is not sure what testing her sister had done.  Her brother had head and neck cancer at unknown age.  Her father had skin melanoma at age 49. A paternal aunt had breast cancer at age 64.    INTERVAL HISTORY:  Laura [name] is here for follow up after completing adjuvant radiation on November 10th.  She tolerated with without significant difficulty other than mild post radiation changes.  She is scheduled with Dr. Lilia Pro in 2 weeks.  Bone density from November 10th revealed a completely normal exam.  She states that she has been well.  She undergoes routine blood work with Dr. Unk Lightning every 6 months, and is scheduled with him in January.  Her  appetite is good, and she has gained 3 pounds since her last visit.  She denies fever, chills or other signs of infection.  She denies nausea, vomiting, bowel issues, or abdominal pain.  She denies sore throat, cough, dyspnea, or chest pain.   REVIEW OF SYSTEMS:  Review of Systems  Skin:       Erythema and post radiation changes of the right breast  All other systems reviewed and are  negative.    VITALS:  Blood pressure 138/65, pulse 96, temperature 98.2 F (36.8 C), temperature source Oral, resp. rate 18, height _0  (1.549 m), weight 217 lb 1.6 oz (98.5 kg), SpO2 96 %.  Wt Readings from Last 3 Encounters:  08/07/20 217 lb 1.6 oz (98.5 kg)    Body mass index is 41.02 kg/m.  Performance status (ECOG): 1 - Symptomatic but completely ambulatory  PHYSICAL EXAM:  Physical Exam Constitutional:      General: She is not in acute distress.    Appearance: Normal appearance. She is normal weight.  HENT:     Head: Normocephalic and atraumatic.  Eyes:     General: No scleral icterus.    Extraocular Movements: Extraocular movements intact.     Conjunctiva/sclera: Conjunctivae normal.     Pupils: Pupils are equal, round, and reactive to light.  Cardiovascular:     Rate and Rhythm: Normal rate and regular rhythm.  Pulses: Normal pulses.     Heart sounds: Normal heart sounds. No murmur heard.  No friction rub. No gallop.   Pulmonary:     Effort: Pulmonary effort is normal. No respiratory distress.     Breath sounds: Normal breath sounds.  Chest:     Comments: There is a firm scar in the upper inner quadrant of the right breast with a seroma beneath the scar about 4 cm across.  She also has a well healed right axillary scar.  Diffuse erythema and changes of the skin consistent with radiation, and one small area of desquamation in the right axilla.  No masses in either breast. Abdominal:     General: Bowel sounds are normal. There is no distension.     Palpations: Abdomen is soft. There is no mass.     Tenderness: There is no abdominal tenderness.  Musculoskeletal:        General: Normal range of motion.     Cervical back: Normal range of motion and neck supple.     Right lower leg: No edema.     Left lower leg: No edema.  Lymphadenopathy:     Cervical: No cervical adenopathy.  Skin:    General: Skin is warm and dry.  Neurological:     General: No focal  deficit present.     Mental Status: She is alert and oriented to person, place, and time. Mental status is at baseline.  Psychiatric:        Mood and Affect: Mood normal.        Behavior: Behavior normal.        Thought Content: Thought content normal.        Judgment: Judgment normal.     LABS:   CBC 07/29/2007 07/27/2007 07/26/2007  WBC 14.6(H) 18.9(H) 23.6(H)  Hemoglobin 8.7(L) 9.5(L) 10.3(L)  Hematocrit 25.3(L) 28.3(L) 30.8(L)  Platelets 774(H) 869(H) 1019 CRITICAL RESULT CALLED TO, READ BACK BY AND VERIFIED WITH: P.GOINS,RN 1027 07/26/07 CLARK,S(HH)   CMP 07/29/2007 07/28/2007 07/27/2007  Glucose 130(H) 117(H) 112(H)  BUN _0 Creatinine 0.59 0.51 0.53  Sodium 134(L) 134(L) 133(L)  Potassium 4.4 4.0 DELTA CHECK NOTED 3.3(L)  Chloride 100 99 96  CO2 _1 Calcium 8.7 8.9 9.2  Total Protein - - 6.5  Total Bilirubin - - 1.0  Alkaline Phos - - 134(H)  AST - - 30  ALT - - 50(H)     STUDIES:   She underwent DXA for bone mineral density on 07/31/2020 revealing a completely normal exam.  Allergies:  Allergies  Allergen Reactions  . Morphine Nausea And Vomiting and Nausea Only    Current Medications: Current Outpatient Medications  Medication Sig Dispense Refill  . benazepril-hydrochlorthiazide (LOTENSIN HCT) 20-25 MG tablet Take 1 tablet by mouth daily.    . carvedilol (COREG) 12.5 MG tablet Take 12.5 mg by mouth 2 (two) times daily.    . furosemide (LASIX) 20 MG tablet Take 1 tablet by mouth daily.    . hydroxyurea (HYDREA) 500 MG capsule Take 1,000 mg by mouth daily.    Marland Kitchen KLOR-CON M20 20 MEQ tablet Take 20 mEq by mouth 4 (four) times daily. Takes 65mq (4 per day)    . metFORMIN (GLUCOPHAGE) 1000 MG tablet Take 1,000 mg by mouth 2 (two) times daily.    . Multiple Vitamin (MULTIVITAMIN) tablet Take 1 tablet by mouth daily.    . niacin 500 MG tablet Take 500 mg by mouth daily.    .Marland Kitchen  omega-3 fish oil (MAXEPA) 1000 MG CAPS capsule Take 2 tablets by mouth daily.     . simvastatin (ZOCOR) 40 MG tablet Take 40 mg by mouth at bedtime.    Marland Kitchen aspirin 325 MG tablet Take 1 tablet by mouth daily.     No current facility-administered medications for this visit.     ASSESSMENT & PLAN:   Assessment:   1. Essential thrombocytosis, which is well controlled with hydroxyurea 1000 mg daily.    2.  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  Hormone receptors were highly positive.  Genetic testing was negative.  Plan: She completed adjuvant radiation on November 10th and tolerated treatment without significant difficulty.  As her hormone receptors were highly positive, I recommend hormone therapy with an aromatase inhibitor such as anastrozole for a total of 5 years.  Bone density scan from November 10th was completely normal, and she will need to continue these every 2 years while on this medication.  I reviewed the potential toxicities of anastrozole and she is in agreement, so I will send this prescription in today.  We will plan to see her back in 3 months with CBC for repeat examination.  The patient understands the plans discussed today and is in agreement with them.  She knows to contact our office if she develops concerns prior to her next appointment.    I provided 15 minutes of face-to-face time during this this encounter and > 50% was spent counseling as documented under my assessment and plan.    Derwood Kaplan, MD [facility] [address] [place] [address] Dept: [phone] Dept Fax: [phone]   I, Rita Ohara, am acting as scribe for Derwood Kaplan, MD  I have reviewed this report as typed by the medical scribe, and it is complete and accurate.

## 2020-08-07 ENCOUNTER — Inpatient Hospital Stay (INDEPENDENT_AMBULATORY_CARE_PROVIDER_SITE_OTHER): Payer: Medicare HMO | Admitting: Oncology

## 2020-08-07 ENCOUNTER — Other Ambulatory Visit: Payer: Self-pay

## 2020-08-07 ENCOUNTER — Encounter: Payer: Self-pay | Admitting: Oncology

## 2020-08-07 ENCOUNTER — Inpatient Hospital Stay: Payer: Medicare HMO | Attending: Oncology

## 2020-08-07 ENCOUNTER — Telehealth: Payer: Self-pay | Admitting: Oncology

## 2020-08-07 ENCOUNTER — Other Ambulatory Visit: Payer: Self-pay | Admitting: Oncology

## 2020-08-07 DIAGNOSIS — Z7984 Long term (current) use of oral hypoglycemic drugs: Secondary | ICD-10-CM | POA: Diagnosis not present

## 2020-08-07 DIAGNOSIS — Z79899 Other long term (current) drug therapy: Secondary | ICD-10-CM | POA: Diagnosis not present

## 2020-08-07 DIAGNOSIS — C50111 Malignant neoplasm of central portion of right female breast: Secondary | ICD-10-CM

## 2020-08-07 DIAGNOSIS — D473 Essential (hemorrhagic) thrombocythemia: Secondary | ICD-10-CM | POA: Insufficient documentation

## 2020-08-07 DIAGNOSIS — E876 Hypokalemia: Secondary | ICD-10-CM | POA: Diagnosis not present

## 2020-08-07 DIAGNOSIS — Z8049 Family history of malignant neoplasm of other genital organs: Secondary | ICD-10-CM

## 2020-08-07 DIAGNOSIS — N289 Disorder of kidney and ureter, unspecified: Secondary | ICD-10-CM

## 2020-08-07 DIAGNOSIS — C50911 Malignant neoplasm of unspecified site of right female breast: Secondary | ICD-10-CM | POA: Diagnosis not present

## 2020-08-07 DIAGNOSIS — R7401 Elevation of levels of liver transaminase levels: Secondary | ICD-10-CM

## 2020-08-07 DIAGNOSIS — Z803 Family history of malignant neoplasm of breast: Secondary | ICD-10-CM

## 2020-08-07 DIAGNOSIS — Z170 Estrogen receptor positive status [ER+]: Secondary | ICD-10-CM

## 2020-08-07 DIAGNOSIS — Z808 Family history of malignant neoplasm of other organs or systems: Secondary | ICD-10-CM

## 2020-08-07 DIAGNOSIS — Z800 Family history of malignant neoplasm of digestive organs: Secondary | ICD-10-CM

## 2020-08-07 LAB — CBC WITH DIFFERENTIAL (CANCER CENTER ONLY)
Abs Immature Granulocytes: 0.08 10*3/uL — ABNORMAL HIGH (ref 0.00–0.07)
Basophils Absolute: 0 10*3/uL (ref 0.0–0.1)
Basophils Relative: 1 %
Eosinophils Absolute: 0.1 10*3/uL (ref 0.0–0.5)
Eosinophils Relative: 2 %
HCT: 36.8 % (ref 36.0–46.0)
Hemoglobin: 12.9 g/dL (ref 12.0–15.0)
Immature Granulocytes: 1 %
Lymphocytes Relative: 22 %
Lymphs Abs: 1.2 10*3/uL (ref 0.7–4.0)
MCH: 41.6 pg — ABNORMAL HIGH (ref 26.0–34.0)
MCHC: 35.1 g/dL (ref 30.0–36.0)
MCV: 118.7 fL — ABNORMAL HIGH (ref 80.0–100.0)
Monocytes Absolute: 0.8 10*3/uL (ref 0.1–1.0)
Monocytes Relative: 15 %
Neutro Abs: 3.4 10*3/uL (ref 1.7–7.7)
Neutrophils Relative %: 59 %
Platelet Count: 408 10*3/uL — ABNORMAL HIGH (ref 150–400)
RBC: 3.1 MIL/uL — ABNORMAL LOW (ref 3.87–5.11)
RDW: 14.4 % (ref 11.5–15.5)
WBC Count: 5.7 10*3/uL (ref 4.0–10.5)
nRBC: 0 % (ref 0.0–0.2)

## 2020-08-07 MED ORDER — ANASTROZOLE 1 MG PO TABS: 1.0000 mg | ORAL_TABLET | Freq: Every day | ORAL | 3 refills | Status: DC

## 2020-08-07 NOTE — Telephone Encounter (Signed)
Per 11/17 LOS, Patient scheduled for 11/06/20 Labs, Follow Up.  Gave Summary to patient

## 2020-08-08 ENCOUNTER — Telehealth: Payer: Self-pay

## 2020-08-08 NOTE — Telephone Encounter (Signed)
Called patient to let her know about her lab results. Patient also wanted to ask Dr. Hinton Rao: When does she need start her Arimidex?

## 2020-08-08 NOTE — Telephone Encounter (Signed)
Patient notified

## 2020-08-08 NOTE — Telephone Encounter (Signed)
-----   Message from Derwood Kaplan, MD sent at 08/08/2020  2:39 PM EST ----- Regarding: RE: call pt I usually rec 1 month after completes her radiation ----- Message ----- From: Belva Chimes, LPN Sent: 11/88/6773   1:27 PM EST To: Derwood Kaplan, MD Subject: RE: call pt                                    When does patient need to start her Arimidex?  ----- Message ----- From: Derwood Kaplan, MD Sent: 08/07/2020   2:40 PM EST To: Belva Chimes, LPN Subject: call pt                                        Tell her platelets are good at 408,000.  Send copy of her labs to her PCP

## 2020-08-08 NOTE — Telephone Encounter (Signed)
-----   Message from Derwood Kaplan, MD sent at 08/07/2020  2:40 PM EST ----- Regarding: call pt Tell her platelets are good at 408,000.  Send copy of her labs to her PCP

## 2020-08-13 ENCOUNTER — Telehealth: Payer: Self-pay

## 2020-08-13 NOTE — Telephone Encounter (Addendum)
Pt notified of Dr Remi Deter message below. Pt verbalized understanding. [phone]   ----- Message from Derwood Kaplan, MD sent at 08/12/2020  5:36 PM EST ----- Regarding: call pt Tell her platelets good at 408,000, stay on same medication.  I can't recall if I already had you call her

## 2020-10-14 ENCOUNTER — Other Ambulatory Visit: Payer: Self-pay | Admitting: Hematology and Oncology

## 2020-10-14 DIAGNOSIS — D473 Essential (hemorrhagic) thrombocythemia: Secondary | ICD-10-CM

## 2020-10-21 IMAGING — MG MM BREAST BX W/ LOC DEV EA AD LESION IMAG BX SPEC STEREO GUIDE*R
7 of 9 series · 7 of 21 positions shown · non-contrast
Comparison: Previous exams.
COMPARISON: Previous exams.

Addendum:
CLINICAL DATA: 69-year-old with a screening detected possible mass
or asymmetry involving the INNER RIGHT breast at middle depth
without a convincing sonographic correlate and a new 6 mm group of
indeterminate calcifications in the UPPER breast at far posterior
depth.

EXAM:
LEFT BREAST STEREOTACTIC CORE NEEDLE BIOPSY x 2

[R (1 of 3)]
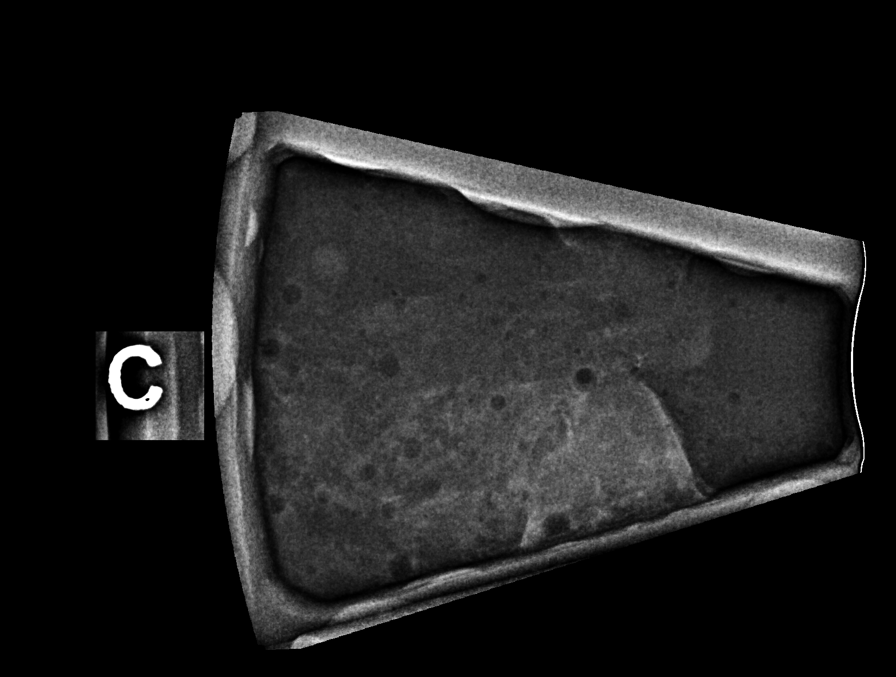

[R (2 of 3)]
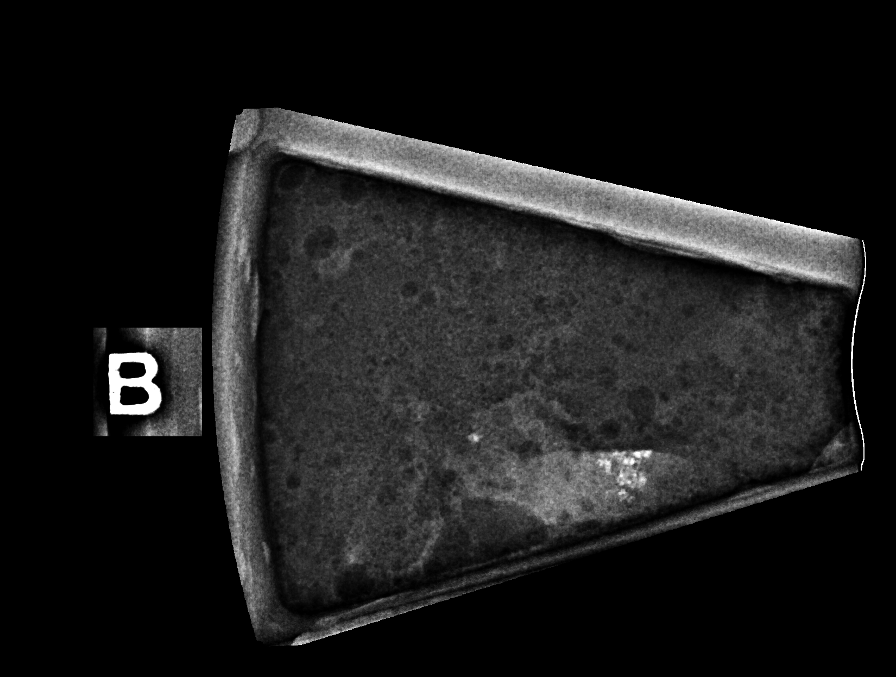

[R (3 of 3)]
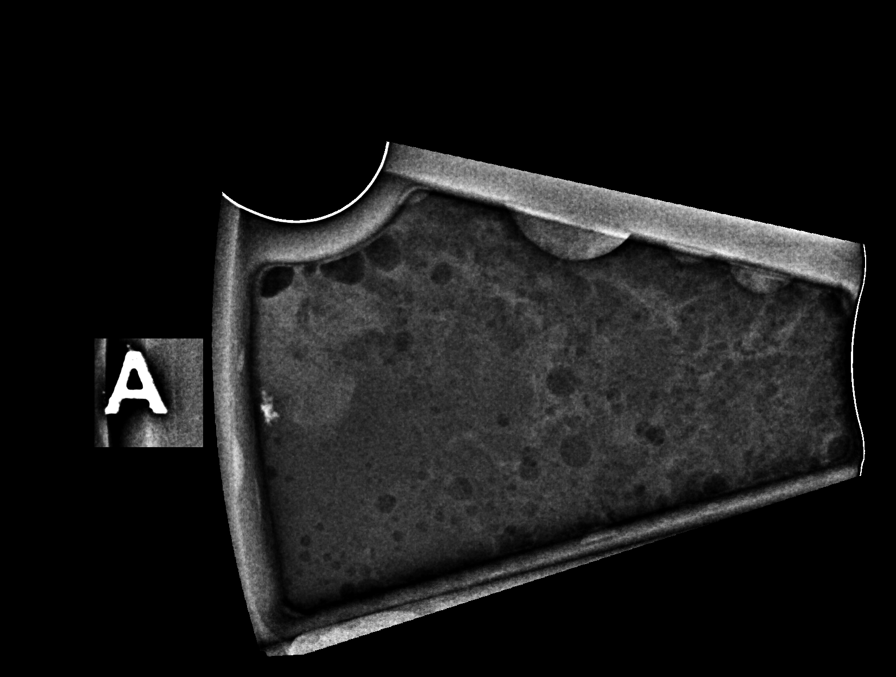

[R LM (1 of 3)]
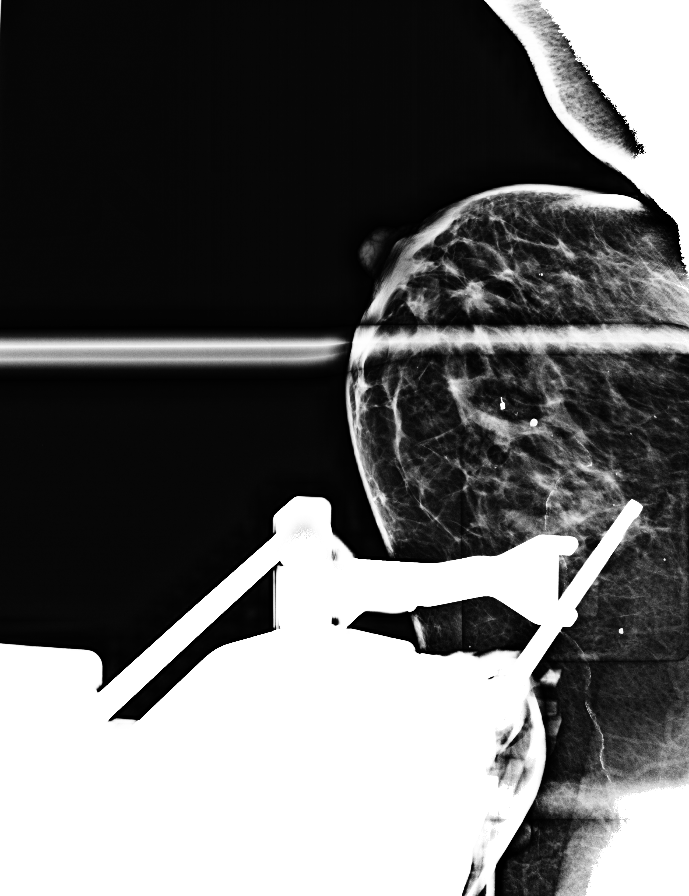

[R LM (2 of 3)]
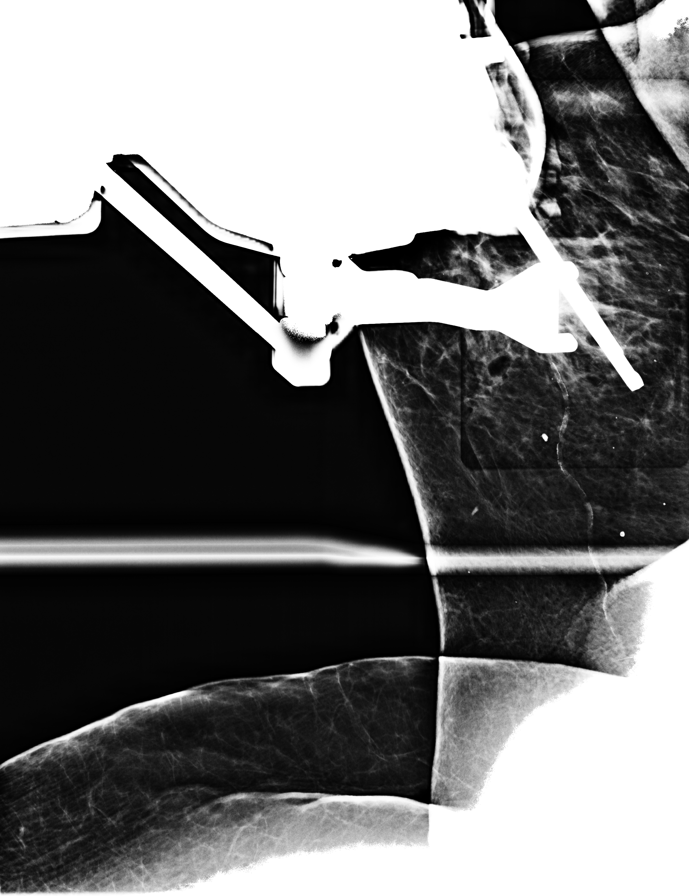

[R LM (3 of 3)]
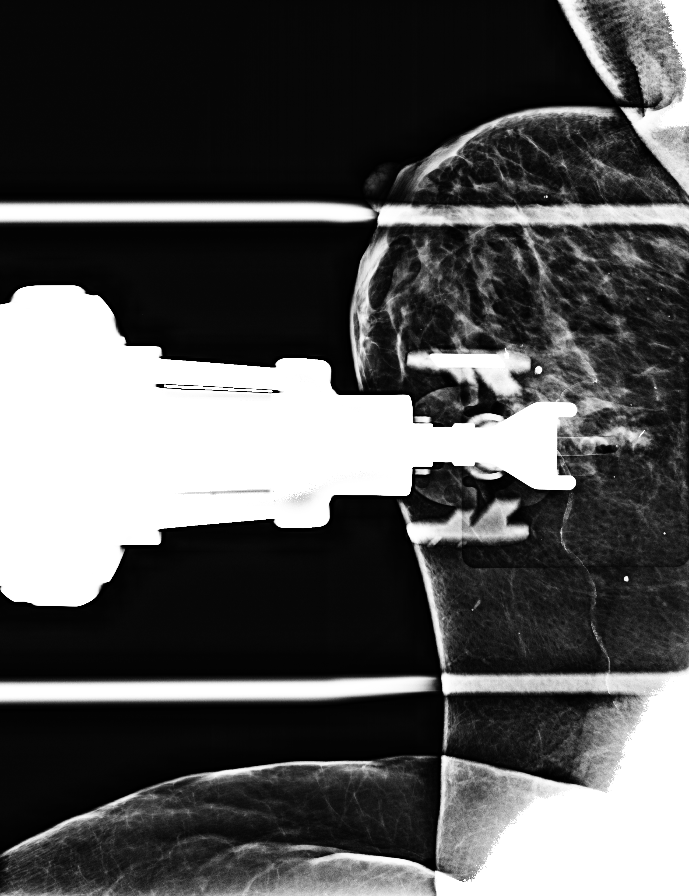

[R LM tomo · tomo slice 31/62.0]
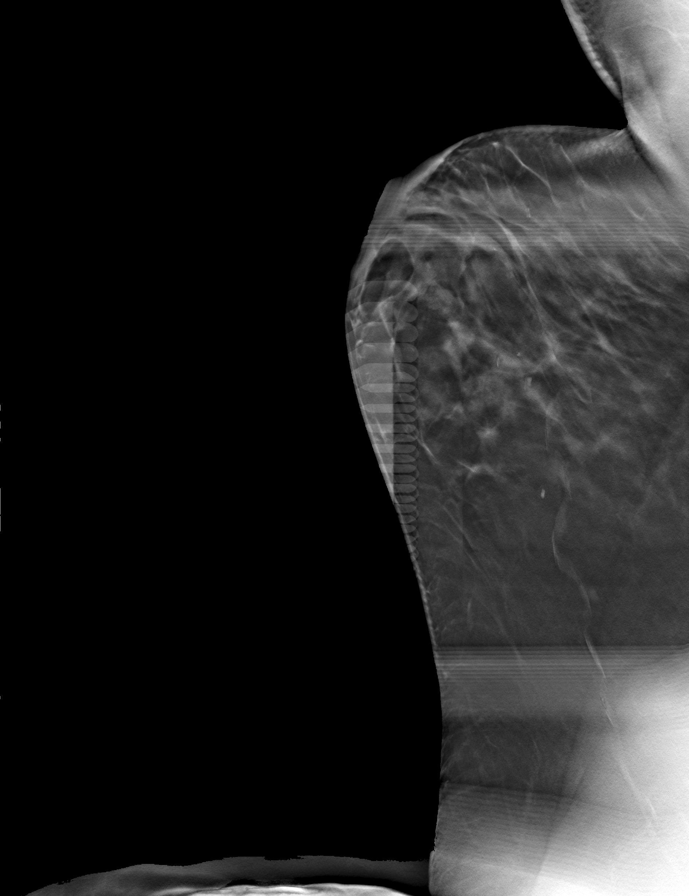

[7 of 21 positions shown; findings below may reference images not displayed]



# 1) Mass or asymmetry, INNER breast: Lesion quadrant: UPPER INNER
QUADRANT.

Using sterile technique with chlorhexidine as skin antisepsis, 1%
lidocaine and 1% lidocaine with epinephrine as local anesthetic,
under stereotactic guidance, a 9 gauge Brevera vacuum assisted
device was used to perform core needle biopsy of the mass or
asymmetry involving the INNER RIGHT breast using a superior
approach. Specimen radiograph was performed showing soft tissue
density in all of the core samples. At the conclusion of the
procedure, a coil shaped tissue marker clip was deployed into the
biopsy cavity.

# 2) Calcifications, UPPER breast: Lesion quadrant: UPPER OUTER
QUADRANT.

Using sterile technique with chlorhexidine as skin antisepsis, 1%
lidocaine and 1% lidocaine with epinephrine as local anesthetic,
under stereotactic guidance, a 9 gauge Brevera vacuum assisted
device was used perform core needle biopsy of the calcifications
involving the UPPER RIGHT breast using a lateral approach. Specimen
radiograph was performed showing calcifications in 2 of the core
samples. Specimens with calcifications were marked for pathology. At
the conclusion of the procedure, an X shaped tissue marker clip was
deployed into the biopsy cavity.

Follow-up 2-view mammogram was performed and dictated separately.
IMPRESSION: Stereotactic-guided biopsy of an indeterminate asymmetry involving
the INNER RIGHT breast at MIDDLE depth and stereotactic-guided
biopsy of new indeterminate calcifications involving the UPPER RIGHT
breast at POSTERIOR depth. No apparent complications.

ADDENDUM:
Pathology revealed GRADE I INVASIVE DUCTAL CARCINOMA, LOW GRADE
DUCTAL CARCINOMA IN SITU of the RIGHT breast, inner, at middle
depth. This was found to be concordant by Dr. Marc Antoine Jean Claude.

Pathology revealed FIBROADENOMATOID CHANGE WITH CALCIFICATIONS of
the RIGHT breast, upper, at posterior depth. This was found to be
concordant by Dr. Marc Antoine Jean Claude.

Pathology results were discussed with the patient by telephone. The
patient reported doing well after the biopsies with tenderness at
the sites. Post biopsy instructions and care were reviewed and
questions were answered. The patient was encouraged to call The

Per patient request, surgical referral will be arranged by Dr.
Wenzel of [facility] Imaging and reports were
faxed to [[facility] at [facility] on 05/02/2020. Dr.
[facility]eng was notified via [[facility]] Inbasket.

Pathology results reported by Wenzel RN on 05/02/2020.



# 1) Mass or asymmetry, INNER breast: Lesion quadrant: UPPER INNER
QUADRANT.

Using sterile technique with chlorhexidine as skin antisepsis, 1%
lidocaine and 1% lidocaine with epinephrine as local anesthetic,
under stereotactic guidance, a 9 gauge Brevera vacuum assisted
device was used to perform core needle biopsy of the mass or
asymmetry involving the INNER RIGHT breast using a superior
approach. Specimen radiograph was performed showing soft tissue
density in all of the core samples. At the conclusion of the
procedure, a coil shaped tissue marker clip was deployed into the
biopsy cavity.

# 2) Calcifications, UPPER breast: Lesion quadrant: UPPER OUTER
QUADRANT.

Using sterile technique with chlorhexidine as skin antisepsis, 1%
lidocaine and 1% lidocaine with epinephrine as local anesthetic,
under stereotactic guidance, a 9 gauge Brevera vacuum assisted
device was used perform core needle biopsy of the calcifications
involving the UPPER RIGHT breast using a lateral approach. Specimen
radiograph was performed showing calcifications in 2 of the core
samples. Specimens with calcifications were marked for pathology. At
the conclusion of the procedure, an X shaped tissue marker clip was
deployed into the biopsy cavity.

Follow-up 2-view mammogram was performed and dictated separately.
IMPRESSION: Stereotactic-guided biopsy of an indeterminate asymmetry involving
the INNER RIGHT breast at MIDDLE depth and stereotactic-guided
biopsy of new indeterminate calcifications involving the UPPER RIGHT
breast at POSTERIOR depth. No apparent complications.

## 2020-11-04 NOTE — Progress Notes (Incomplete)
[facility]  [address] [place],  [place]  [address] [phone]  Clinic Day:  11/04/2020  Referring physician: No ref. provider found   This document serves as a record of services personally performed by Laura Poisson, MD. It was created on their behalf by Laura [name], a trained medical scribe. The creation of this record is based on the scribe's personal observations and the provider's statements to them.   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  She was occasionally treated with pulse busulfan, but then was lost to follow-up in 2011. She was referred back in March 2014, when her platelet count reached 1.4 million, and she has been on hydroxyurea since that time.  She had been taking hydroxyurea a 1000 mg daily.  She was lost to follow-up again from May 2017 to July 2018. She continued on hydroxyurea during that time.  She was on 1000 mg daily alternating with 1500 mg daily when she was seen in October.  At that time the platelet count was down to 337,000, so we recommended decreasing the hydroxyurea to a 1000 mg daily.  Her platelet count was up to 533,000 in January 2019 at Laura [name] office, so the hydroxyurea was increased to 1500 mg daily.  The platelets remained in that range at her visit here in February, so we kept her on 1500 mg daily.  In March, her platelet count was 316,000 with a normal white count and hemoglobin, so we continued the same dose of hydroxyurea  She has had mild renal insufficiency and has had intermittent hypokalemia, for which she was taking potassium chloride 20 mEq 3 times daily.  She has had chronic elevation of the liver transaminases since 2014, felt to be  most likely due to medications.  When she was seen for routine follow-up in October 2019 she had anemia and her platelet count had dropped to 154,000.  Hydroxyurea was placed on hold and we followed her CBC closely.  The anemia slowly resolved and platelet count slowly increased and was up to 568,000 in January 2020.  At that time, we resumed hydroxyurea 500 mg daily.  In February, her platelets remained elevated at 743,000, so hydroxyurea was increased to 1000 mg.  She has done well since that time with her platelets running under 500,000. When she was seen in July, she still had mild hypokalemia despite potassium chloride 20 mEq 3 times a day, so we recommended she increase that to 4 times daily.  She reported contracting COVID-19 back in December 2020.    Annual screening bilateral mammogram from May 13th 2021 revealed a possible mass and an area of calcifications of the right breast.  No findings suspicious of malignancy were found within the left breast.  Right diagnostic mammogram and ultrasound were finally done on August 5th and revealed a 0.9 cm mass in the medial right breast, as well as an indeterminate group of coarse heterogeneous calcifications measuring 0.6 cm in the upper outer right breast.  She underwent biopsy of these areas.  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  Estrogen and progesterone receptors were highly positive at 100%, and HER2/neu  negative, with a Ki 67 of 1%.  The biopsy of the area of calcifications revealed fibro-adenomatoid changes with calcifications.  She, therefore, has a clinical stage IA (T1b N0 M0) hormone receptor positive right breast cancer.  Dr. Lilia [name] performed right breast lumpectomy and sentinel lymph nodes biopsy on September 13th.  Surgical pathology from this procedure confirmed invasive ductal carcinoma, grade 1, and low grade ductal carcinoma in situ, measuring a total of 9  mm.  All margins were negative for malignancy and 1 lymph node was negative for a T1b N0 M0, stage I A.    Due to her family history of malignancy, we previously discussed genetic testing, but she had declined, as her sister's testing was negative.  Due to her family history of colon cancer, she has been undergoing colonoscopy at least every 5 years.  She is due for colonoscopy at this time.  The patient's mother had endometrial cancer at age 68 and breast cancer at age 8, then developed non-Hodgkin's lymphoma at age 30. Her maternal grandmother had breast cancer at age 20. Her maternal grandfather had lung cancer at unknown age.  Her sister had a history of colon cancer at age 72 and then developed a new primary breast cancer at age 9.  Unfortunately, her sister died of metastatic breast cancer.  She states that her sister underwent genetic testing which was apparently negative, but she is not sure what testing her sister had done.  Her brother had head and neck cancer at unknown age.  Her father had skin melanoma at age 66. A paternal aunt had breast cancer at age 59.  She completed adjuvant radiation on November 10th.  INTERVAL HISTORY:  Laura [name] is here for follow up after completing adjuvant radiation on November 10th.  She tolerated with without significant difficulty other than mild post radiation changes.  She is scheduled with Dr. Lilia [name] in 2 weeks.  Bone density from November 10th revealed a completely normal exam.  She states that she has been well.  She undergoes routine blood work with Laura [name] every 6 months, and is scheduled with him in January.  Her  appetite is good, and she has gained 3 pounds since her last visit.  She denies fever, chills or other signs of infection.  She denies nausea, vomiting, bowel issues, or abdominal pain.  She denies sore throat, cough, dyspnea, or chest pain.  Laura [name] is here for routine follow up ***.  She started hormonal therapy with anastrozole in November 2021,  and has tolerated this without significant difficulty.     Her  appetite is good, and she has gained/lost _ pounds since her last visit.  She denies fever, chills or other signs of infection.  She denies nausea, vomiting, bowel issues, or abdominal pain.  She denies sore throat, cough, dyspnea, or chest pain.  REVIEW OF SYSTEMS:  Review of Systems - Oncology   VITALS:  There were no vitals taken for this visit.  Wt Readings from Last 3 Encounters:  08/07/20 217 lb 1.6 oz (98.5 kg)    There is no height or weight on file to calculate BMI.  Performance status (ECOG): 1 - Symptomatic but completely ambulatory  PHYSICAL EXAM:  Physical Exam  LABS:   CBC Latest Ref Rng & Units 08/07/2020 07/29/2007 07/27/2007  WBC 4.0 - 10.5 K/uL 5.7 14.6(H) 18.9(H)  Hemoglobin 12.0 - 15.0 g/dL 12.9 8.7(L) 9.5(L)  Hematocrit 36.0 - 46.0 % 36.8 25.3(L) 28.3(L)  Platelets 150 - 400 K/uL 408(H)  774(H) 869(H)   CMP 07/29/2007 07/28/2007 07/27/2007  Glucose 130(H) 117(H) 112(H)  BUN '13 14 14  ' Creatinine 0.59 0.51 0.53  Sodium 134(L) 134(L) 133(L)  Potassium 4.4 4.0 DELTA CHECK NOTED 3.3(L)  Chloride 100 99 96  CO2 '28 27 29  ' Calcium 8.7 8.9 9.2  Total Protein - - 6.5  Total Bilirubin - - 1.0  Alkaline Phos - - 134(H)  AST - - 30  ALT - - 50(H)     STUDIES:   She underwent DXA for bone mineral density on 07/31/2020 revealing a completely normal exam.  Allergies:  Allergies  Allergen Reactions  . Morphine Nausea And Vomiting and Nausea Only    Current Medications: Current Outpatient Medications  Medication Sig Dispense Refill  . anastrozole (ARIMIDEX) 1 MG tablet Take 1 tablet (1 mg total) by mouth daily. 90 tablet 3  . aspirin 325 MG tablet Take 1 tablet by mouth daily.    . benazepril-hydrochlorthiazide (LOTENSIN HCT) 20-25 MG tablet Take 1 tablet by mouth daily.    . carvedilol (COREG) 12.5 MG tablet Take 12.5 mg by mouth 2 (two) times daily.    . furosemide (LASIX) 20 MG tablet Take 1  tablet by mouth daily.    . hydroxyurea (HYDREA) 500 MG capsule Take 1 capsule by mouth twice daily 60 capsule 5  . KLOR-CON M20 20 MEQ tablet Take 20 mEq by mouth 4 (four) times daily. Takes 9mq (4 per day)    . metFORMIN (GLUCOPHAGE) 1000 MG tablet Take 1,000 mg by mouth 2 (two) times daily.    . Multiple Vitamin (MULTIVITAMIN) tablet Take 1 tablet by mouth daily.    . niacin 500 MG tablet Take 500 mg by mouth daily.    .Marland Kitchenomega-3 fish oil (MAXEPA) 1000 MG CAPS capsule Take 2 tablets by mouth daily.    . simvastatin (ZOCOR) 40 MG tablet Take 40 mg by mouth at bedtime.     No current facility-administered medications for this visit.     ASSESSMENT & PLAN:   Assessment:   1. Essential thrombocytosis, which is well controlled with hydroxyurea 1000 mg daily.    2.  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  Hormone receptors were highly positive.  Genetic testing was negative.  Plan: She started hormonal therapy with anastrozole 1 mg daily in November, and has tolerated this without significant difficulty.  We will plan to see her back in 3 months with CBC for repeat examination.  The patient understands the plans discussed today and is in agreement with them.  She knows to contact our office if she develops concerns prior to her next appointment.    I provided 15 minutes of face-to-face time during this this encounter and > 50% was spent counseling as documented under my assessment and plan.    CDerwood Kaplan MD CGrafton City HospitalAT AContinuous [facility]SColburnNAlaska[address]Dept: [phone] Fax: [phone]  I, LRita Ohara am acting as scribe for CDerwood Kaplan MD  I have reviewed this report as typed by the medical scribe, and it is complete and accurate.

## 2020-11-05 NOTE — Progress Notes (Signed)
[facility]  [address] [place],  [place]  [address] [phone]  Clinic Day:  11/06/2020  Referring physician: No ref. provider found    CHIEF COMPLAINT:  CC: A 70 year old female with a history of stage IA invasive ductal carcinoma of the right breast and essential thrombocytosis here for 3 month evaluation.  Current Treatment:  Hydroxyurea 1000 mg daily; Anastrazole daily   HISTORY OF PRESENT ILLNESS:  Laura [name] is a 69 y.o. 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  She was occasionally treated with pulse busulfan, but then was lost to follow-up in 2011. She was referred back in March 2014, when her platelet count reached 1.4 million, and she has been on hydroxyurea since that time.  She had been taking hydroxyurea a 1000 mg daily.  She was lost to follow-up again from May 2017 to July 2018. She continued on hydroxyurea during that time.  She was on 1000 mg daily alternating with 1500 mg daily when she was seen in October.  At that time the platelet count was down to 337,000, so we recommended decreasing the hydroxyurea to a 1000 mg daily.  Her platelet count was up to 533,000 in January 2019 at Dr. Unk Lightning office, so the hydroxyurea was increased to 1500 mg daily.  The platelets remained in that range at her visit here in February, so we kept her on 1500 mg daily.  In March, her platelet count was 316,000 with a normal white count and hemoglobin, so we continued the same dose of hydroxyurea  She has had mild renal insufficiency and has had intermittent hypokalemia, for which she was taking potassium chloride 20 mEq 3 times daily.  She has had chronic elevation of the liver transaminases since 2014, felt to be most likely due to medications.  When she was seen for routine follow-up in October 2019 she had anemia and her platelet count had dropped to 154,000.  Hydroxyurea was placed on hold and we  followed her CBC closely.  The anemia slowly resolved and platelet count slowly increased and was up to 568,000 in January 2020.  At that time, we resumed hydroxyurea 500 mg daily.  In February, her platelets remained elevated at 743,000, so hydroxyurea was increased to 1000 mg.  She has done well since that time with her platelets running under 500,000.   Annual screening bilateral mammogram from May 13th 2021 revealed a possible mass and an area of calcifications of the right breast.  No findings suspicious of malignancy were found within the left breast.  Right diagnostic mammogram and ultrasound were finally done on August 5th and revealed a 0.9 cm mass in the medial right breast, as well as an indeterminate group of coarse heterogeneous calcifications measuring 0.6 cm in the upper outer right breast.  She underwent biopsy of these areas.  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  Estrogen and progesterone receptors were highly positive at 100%, and HER2/neu negative, with a Ki 67 of 1%.  The biopsy of the area of calcifications revealed fibro-adenomatoid changes with calcifications.  She, therefore, has a clinical stage IA (T1b N0 M0) hormone receptor positive right breast cancer.  Dr. Lilia Pro performed right breast lumpectomy and sentinel lymph nodes biopsy on September 13th.  Surgical pathology from this procedure confirmed invasive ductal carcinoma, grade 1, and low grade ductal carcinoma in situ, measuring  a total of 9 mm.  All margins were negative for malignancy and 1 lymph node was negative for a T1b N0 M0, stage I A.    Due to her family history of malignancy, we previously discussed genetic testing, but she had declined, as her sister's testing was negative.  Due to her family history of colon cancer, she has been undergoing colonoscopy at least every 5 years.  She is due for colonoscopy at this time.  The  patient's mother had endometrial cancer at age 40 and breast cancer at age 82, then developed non-Hodgkin's lymphoma at age 37. Her maternal grandmother had breast cancer at age 71. Her maternal grandfather had lung cancer at unknown age.  Her sister had a history of colon cancer at age 54 and then developed a new primary breast cancer at age 108.  Unfortunately, her sister died of metastatic breast cancer.  She states that her sister underwent genetic testing which was apparently negative, but she is not sure what testing her sister had done.  Her brother had head and neck cancer at unknown age.  Her father had skin melanoma at age 66. A paternal aunt had breast cancer at age 47.    INTERVAL HISTORY:  Laura [name] is here for evaluation of her breast cancer and essential thrombocytosis.  She reports being well since her last visit. Her metformin was decreased from 1000 mg daily to 500 mg daily and this has helped significantly with some side effects such as nausea and diarrhea. Today she denies fever, chills, nausea or vomiting. She denies shortness of breath, chest pain or cough. She denies issue with bowel or bladder. Her appetite is good and she has lost 5 pounds since her last visit. CBC today is unremarkable with platelets at 366.   REVIEW OF SYSTEMS:  Review of Systems  Constitutional: Negative for appetite change, chills, diaphoresis, fatigue, fever and unexpected weight change.  HENT:   Negative for hearing loss, lump/mass, mouth sores, nosebleeds, sore throat, tinnitus, trouble swallowing and voice change.   Eyes: Negative for eye problems and icterus.  Respiratory: Negative for chest tightness, cough, hemoptysis, shortness of breath and wheezing.   Cardiovascular: Negative for chest pain, leg swelling and palpitations.  Gastrointestinal: Negative for abdominal distention, abdominal pain, blood in stool, constipation, diarrhea, nausea, rectal pain and vomiting.  Endocrine: Negative for hot flashes.   Genitourinary: Negative for bladder incontinence, difficulty urinating, dyspareunia, dysuria, frequency, hematuria and nocturia.   Musculoskeletal: Negative for arthralgias, back pain, flank pain, gait problem, myalgias, neck pain and neck stiffness.  Skin: Negative for itching, rash and wound.  Neurological: Negative for dizziness, extremity weakness, gait problem, headaches, light-headedness, numbness, seizures and speech difficulty.  Hematological: Negative for adenopathy. Does not bruise/bleed easily.  Psychiatric/Behavioral: Negative for confusion, decreased concentration, depression, sleep disturbance and suicidal ideas. The patient is not nervous/anxious.      VITALS:  There were no vitals taken for this visit.  Wt Readings from Last 3 Encounters:  08/07/20 217 lb 1.6 oz (98.5 kg)    There is no height or weight on file to calculate BMI.  Performance status (ECOG): 1 - Symptomatic but completely ambulatory  PHYSICAL EXAM:  Physical Exam Constitutional:      General: She is not in acute distress.    Appearance: Normal appearance. She is normal weight. She is not ill-appearing, toxic-appearing or diaphoretic.  HENT:     Head: Normocephalic and atraumatic.     Nose: Nose normal. No congestion or rhinorrhea.  Mouth/Throat:     Mouth: Mucous membranes are moist.     Pharynx: Oropharynx is clear. No oropharyngeal exudate or posterior oropharyngeal erythema.  Eyes:     General: No scleral icterus.       Right eye: No discharge.        Left eye: No discharge.     Extraocular Movements: Extraocular movements intact.     Conjunctiva/sclera: Conjunctivae normal.     Pupils: Pupils are equal, round, and reactive to light.  Neck:     Vascular: No carotid bruit.  Cardiovascular:     Rate and Rhythm: Normal rate and regular rhythm.     Heart sounds: No murmur heard. No friction rub. No gallop.   Pulmonary:     Effort: Pulmonary effort is normal. No respiratory distress.      Breath sounds: Normal breath sounds. No stridor. No wheezing, rhonchi or rales.  Chest:     Chest wall: No mass, lacerations, deformity, swelling, tenderness, crepitus or edema. There is no dullness to percussion.  Breasts: Breasts are symmetrical.     Right: Normal. No swelling, bleeding, inverted nipple, mass, nipple discharge, skin change, tenderness, axillary adenopathy or supraclavicular adenopathy.     Left: Normal. No swelling, bleeding, inverted nipple, mass, nipple discharge, skin change, tenderness, axillary adenopathy or supraclavicular adenopathy.    Abdominal:     General: Abdomen is flat. Bowel sounds are normal. There is no distension.     Palpations: There is no mass.     Tenderness: There is no abdominal tenderness. There is no right CVA tenderness, left CVA tenderness, guarding or rebound.     Hernia: No hernia is present.  Musculoskeletal:        General: No swelling, tenderness, deformity or signs of injury. Normal range of motion.     Cervical back: Normal range of motion and neck supple. No rigidity or tenderness.     Right lower leg: No edema.     Left lower leg: No edema.  Lymphadenopathy:     Cervical: No cervical adenopathy.     Upper Body:     Right upper body: No supraclavicular, axillary or pectoral adenopathy.     Left upper body: No supraclavicular, axillary or pectoral adenopathy.  Skin:    General: Skin is warm and dry.     Capillary Refill: Capillary refill takes less than 2 seconds.     Coloration: Skin is not jaundiced or pale.     Findings: No bruising, erythema, lesion or rash.  Neurological:     General: No focal deficit present.     Mental Status: She is alert and oriented to person, place, and time. Mental status is at baseline.     Cranial Nerves: No cranial nerve deficit.     Sensory: No sensory deficit.     Motor: No weakness.     Coordination: Coordination normal.     Gait: Gait normal.     Deep Tendon Reflexes: Reflexes normal.   Psychiatric:        Mood and Affect: Mood normal.        Behavior: Behavior normal.        Thought Content: Thought content normal.        Judgment: Judgment normal.     LABS:   CBC Latest Ref Rng & Units 11/06/2020 08/07/2020 07/29/2007  WBC - 5.5 5.7 14.6(H)  Hemoglobin 12.0 - 16.0 12.1 12.9 8.7(L)  Hematocrit 36 - 46 35(A) 36.8 25.3(L)  Platelets 150 -  399 366 408(H) 774(H)   CMP 07/29/2007 07/28/2007 07/27/2007  Glucose 130(H) 117(H) 112(H)  BUN '13 14 14  ' Creatinine 0.59 0.51 0.53  Sodium 134(L) 134(L) 133(L)  Potassium 4.4 4.0 DELTA CHECK NOTED 3.3(L)  Chloride 100 99 96  CO2 '28 27 29  ' Calcium 8.7 8.9 9.2  Total Protein - - 6.5  Total Bilirubin - - 1.0  Alkaline Phos - - 134(H)  AST - - 30  ALT - - 50(H)     STUDIES:   She underwent DXA for bone mineral density on 07/31/2020 revealing a completely normal exam.  Allergies:  Allergies  Allergen Reactions  . Morphine Nausea And Vomiting and Nausea Only    Current Medications: Current Outpatient Medications  Medication Sig Dispense Refill  . anastrozole (ARIMIDEX) 1 MG tablet Take 1 tablet (1 mg total) by mouth daily. 90 tablet 3  . aspirin 325 MG tablet Take 1 tablet by mouth daily.    . benazepril-hydrochlorthiazide (LOTENSIN HCT) 20-25 MG tablet Take 1 tablet by mouth daily.    . carvedilol (COREG) 12.5 MG tablet Take 12.5 mg by mouth 2 (two) times daily.    . furosemide (LASIX) 20 MG tablet Take 1 tablet by mouth daily.    . hydroxyurea (HYDREA) 500 MG capsule Take 1 capsule by mouth twice daily 60 capsule 5  . KLOR-CON M20 20 MEQ tablet Take 20 mEq by mouth 4 (four) times daily. Takes 60mq (4 per day)    . metFORMIN (GLUCOPHAGE) 1000 MG tablet Take 1,000 mg by mouth 2 (two) times daily.    . Multiple Vitamin (MULTIVITAMIN) tablet Take 1 tablet by mouth daily.    . niacin 500 MG tablet Take 500 mg by mouth daily.    .Marland Kitchenomega-3 fish oil (MAXEPA) 1000 MG CAPS capsule Take 2 tablets by mouth daily.    .  simvastatin (ZOCOR) 40 MG tablet Take 40 mg by mouth at bedtime.     No current facility-administered medications for this visit.     ASSESSMENT & PLAN:   Assessment:   1. Essential thrombocytosis, which is well controlled with hydroxyurea 1000 mg daily. Her platelet count today is normal at 366.  2.  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  Hormone receptors were highly positive.  Genetic testing was negative. She is tolerating anastrazole well.   Plan: She will continue with hydroxyurea for ET and anastrazole for breast cancer. Her next mammogram is scheduled for August. We will see her back in 3 months with repeat CBC, CMP and examination.   She verbalizes understanding of and agreement to the plans discussed today. She knows to call the office should any new questions or concerns arise.       MMelodye Ped NP CSurgery [facility] Of Sw Florida LLCAT ABethesda Rehabilitation Hospital[address]Dept: [phone] Fax: [phone]

## 2020-11-06 ENCOUNTER — Inpatient Hospital Stay (INDEPENDENT_AMBULATORY_CARE_PROVIDER_SITE_OTHER): Payer: Medicare HMO | Admitting: Hematology and Oncology

## 2020-11-06 ENCOUNTER — Telehealth: Payer: Self-pay | Admitting: Hematology and Oncology

## 2020-11-06 ENCOUNTER — Inpatient Hospital Stay: Payer: Medicare HMO | Attending: Oncology

## 2020-11-06 ENCOUNTER — Other Ambulatory Visit: Payer: Self-pay

## 2020-11-06 ENCOUNTER — Inpatient Hospital Stay: Payer: Medicare HMO | Admitting: Oncology

## 2020-11-06 VITALS — BP 128/66 | HR 83 | Temp 98.2°F | Resp 16 | Ht 61.0 in | Wt 222.2 lb

## 2020-11-06 DIAGNOSIS — D473 Essential (hemorrhagic) thrombocythemia: Secondary | ICD-10-CM | POA: Diagnosis not present

## 2020-11-06 LAB — CBC AND DIFFERENTIAL
HCT: 35 — AB (ref 36–46)
Hemoglobin: 12.1 (ref 12.0–16.0)
Neutrophils Absolute: 3.47
Platelets: 366 (ref 150–399)
WBC: 5.5

## 2020-11-06 LAB — CBC: RBC: 2.95 — AB (ref 3.87–5.11)

## 2020-11-06 NOTE — Telephone Encounter (Signed)
Per 2/16 los next appt sched and given to patient 

## 2021-02-03 ENCOUNTER — Inpatient Hospital Stay: Payer: Medicare HMO

## 2021-02-03 ENCOUNTER — Inpatient Hospital Stay: Payer: Medicare HMO | Admitting: Oncology

## 2021-02-05 ENCOUNTER — Telehealth: Payer: Self-pay | Admitting: Hematology and Oncology

## 2021-02-05 ENCOUNTER — Encounter: Payer: Self-pay | Admitting: Hematology and Oncology

## 2021-02-05 ENCOUNTER — Other Ambulatory Visit: Payer: Self-pay

## 2021-02-05 ENCOUNTER — Inpatient Hospital Stay: Payer: Medicare HMO | Attending: Oncology

## 2021-02-05 ENCOUNTER — Inpatient Hospital Stay: Payer: Medicare HMO | Admitting: Hematology and Oncology

## 2021-02-05 VITALS — BP 137/63 | HR 84 | Temp 98.4°F | Resp 16 | Ht 61.0 in | Wt 223.1 lb

## 2021-02-05 DIAGNOSIS — D473 Essential (hemorrhagic) thrombocythemia: Secondary | ICD-10-CM

## 2021-02-05 DIAGNOSIS — Z170 Estrogen receptor positive status [ER+]: Secondary | ICD-10-CM | POA: Diagnosis not present

## 2021-02-05 DIAGNOSIS — C50211 Malignant neoplasm of upper-inner quadrant of right female breast: Secondary | ICD-10-CM | POA: Diagnosis not present

## 2021-02-05 DIAGNOSIS — C50111 Malignant neoplasm of central portion of right female breast: Secondary | ICD-10-CM

## 2021-02-05 LAB — CBC AND DIFFERENTIAL
HCT: 36 (ref 36–46)
Hemoglobin: 12.5 (ref 12.0–16.0)
Neutrophils Absolute: 3.53
Platelets: 292 (ref 150–399)
WBC: 5.7

## 2021-02-05 LAB — CBC
MCV: 117 — AB (ref 81–99)
RBC: 3.11 — AB (ref 3.87–5.11)

## 2021-02-05 NOTE — Progress Notes (Signed)
[facility]  [address] [place],  [place]  [address] [phone]  Clinic Day:  02/05/2021  Referring physician: No ref. 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  She was occasionally treated with pulse busulfan, but then was lost to follow-up in 2011. She was referred back in March 2014, when her platelet count reached 1.4 million. She has been on hydroxyurea 1000 mg daily.  She was lost to follow-up again from May 2017 to July 2018.  She continued on hydroxyurea during that time.  She had worsening thrombocythemia, so she had increased the hydroxyurea 1000 mg daily alternating with 1500 mg daily.  In October 2018, the platelet count was down to 337,000, so we decreased the hydroxyurea to a 1000 mg daily.  Her platelet count was up to 533,000 in January 2019 at Dr. Unk Lightning office, so the hydroxyurea was increased to 1500 mg daily. Her platelets remained controlled on that dose. She has had mild renal insufficiency. She has hypokalemia, for which she was taking potassium chloride 20 mEq 3 times daily.  She has had chronic elevation of the liver transaminases since 2014, felt to be most likely due to medications.    When she was seen for routine follow-up in October 2019 she had anemia and her platelet count had dropped to 154,000.  Hydroxyurea was placed on hold and we followed her CBC closely.  The anemia slowly resolved and platelet count slowly increased and was up to 568,000 in January 2020.  At that time, we resumed hydroxyurea 500 mg daily.  In February, her platelets remained elevated at 743,000, so hydroxyurea was increased to 1000  mg.  She has done well since that time with her platelets running under 500,000.   Due to her family history of malignancy, we had previously discussed genetic testing, but she had declined, as her sister's testing was negative.  Due to her family history of colon cancer, she has been undergoing colonoscopy at least every 5 years. The patient's mother had endometrial cancer at age 39 and breast cancer at age 62, then developed non-Hodgkin's lymphoma at age 96. Her maternal grandmother had breast cancer at age 75. Her maternal grandfather had lung cancer at unknown age.  Her sister had a history of colon cancer at age 13 and then developed a primary breast cancer at age 15. Unfortunately, her sister died of metastatic breast cancer.  She states that her sister underwent genetic testing, which was apparently negative, but she is not sure what testing her sister had done.  Her brother had head and neck cancer at unknown age.  Her father had skin melanoma at age 6. A paternal aunt had breast cancer at age 51.  In May 2021, she was found to have a clinical stage IA (T1b N0 M0) hormone receptor positive breast cancer.  Annual screening bilateral mammogram in May 2021 revealed a possible mass and an area of calcifications of the right breast.  No findings suspicious for malignancy were found within the left breast.   She finally underwent right diagnostic mammogram and ultrasound in August.  This revealed a 0.9 cm mass in the medial  right breast, as well as an indeterminate group of coarse heterogeneous calcifications measuring 0.6 cm in the upper outer right breast.  She underwent biopsy of these areas.  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  Estrogen and progesterone receptors were highly positive at 100%, and HER2/neu negative.  Ki 67 was 1%.  The biopsy of the area of calcifications revealed fibro-adenomatoid changes with  calcifications.  She underwent testing for hereditary cancer syndromes with the [facility] Hereditary Cancer Panel test in August 2021.  This did not reveal any clinically significant mutation or variants of uncertain significance. She was treated with lumpectomy and sentinel lymph node biopsy in September. Surgical pathology from this procedure confirmed invasive ductal carcinoma, grade 1, and low grade ductal carcinoma in situ, measuring a total of 9 mm. Margins were negative for malignancy. 1 sentinel lymph node was negative for metastasis, so pathologic stage IA (pT1b pN0).  She received adjuvant radiation therapy to the right breast. Bone density scan in November was normal.  She was placed on anastrozole 1 mg daily in November.  INTERVAL HISTORY:  Laura [name] is here today for repeat clinical assessment and states she continues hydroxyurea 1000 mg daily.  She continues anastrozole daily without significant difficulty.  She does have hot flashes.  She reports firmness along the incision in her right breast.  She otherwise denies changes in her breasts. She denies fevers or chills. She reports chronic left knee pain, which is stable. Her appetite is good. Her weight has been stable.  She continues to follow up with Dr. Lilia Pro and is scheduled for mammogram in August.  REVIEW OF SYSTEMS:  Review of Systems  Constitutional: Negative for appetite change, chills, fatigue, fever and unexpected weight change.  HENT:   Negative for lump/mass, mouth sores and sore throat.   Respiratory: Negative for cough and shortness of breath.   Cardiovascular: Negative for chest pain and leg swelling.  Gastrointestinal: Negative for abdominal pain, constipation, diarrhea, nausea and vomiting.  Endocrine: Positive for hot flashes.  Genitourinary: Negative for difficulty urinating, dysuria, frequency and hematuria.   Musculoskeletal: Positive for arthralgias (Left knee pain). Negative for back pain and myalgias.  Skin:  Negative for rash.  Neurological: Negative for dizziness and headaches.  Hematological: Negative for adenopathy. Does not bruise/bleed easily.  Psychiatric/Behavioral: Negative for depression and sleep disturbance. The patient is not nervous/anxious.      VITALS:  Blood pressure 137/63, pulse 84, temperature 98.4 F (36.9 C), resp. rate 16, height _0  (1.549 m), weight 223 lb 1.6 oz (101.2 kg), SpO2 95 %.  Wt Readings from Last 3 Encounters:  02/05/21 223 lb 1.6 oz (101.2 kg)  11/06/20 222 lb 3.2 oz (100.8 kg)  08/07/20 217 lb 1.6 oz (98.5 kg)    Body mass index is 42.15 kg/m.  Performance status (ECOG): 0 - Asymptomatic  PHYSICAL EXAM:  Physical Exam Vitals and nursing note reviewed.  Constitutional:      General: She is not in acute distress.    Appearance: Normal appearance.  HENT:     Head: Normocephalic and atraumatic.     Mouth/Throat:     Mouth: Mucous membranes are moist.     Pharynx: Oropharynx is clear. No oropharyngeal exudate or posterior oropharyngeal erythema.  Eyes:     General: No scleral icterus.    Extraocular Movements: Extraocular movements intact.     Conjunctiva/sclera: Conjunctivae normal.  Pupils: Pupils are equal, round, and reactive to light.  Cardiovascular:     Rate and Rhythm: Normal rate and regular rhythm.     Heart sounds: Normal heart sounds. No murmur heard. No friction rub. No gallop.   Pulmonary:     Effort: Pulmonary effort is normal.     Breath sounds: Normal breath sounds. No wheezing, rhonchi or rales.  Chest:  Breasts:     Right: Swelling (2-3cm area of firmness inferior to the right upper inner quadrant breast incision, consistent with a seroma) present. No bleeding, inverted nipple, mass, nipple discharge, skin change, tenderness, axillary adenopathy or supraclavicular adenopathy.     Left: Normal. No swelling, bleeding, inverted nipple, mass, nipple discharge, skin change, tenderness, axillary adenopathy or supraclavicular  adenopathy.    Abdominal:     General: There is no distension.     Palpations: Abdomen is soft. There is no hepatomegaly, splenomegaly or mass.     Tenderness: There is no abdominal tenderness.  Musculoskeletal:        General: Normal range of motion.     Cervical back: Normal range of motion and neck supple. No tenderness.     Right lower leg: No edema.     Left lower leg: No edema.  Lymphadenopathy:     Cervical: No cervical adenopathy.     Upper Body:     Right upper body: No supraclavicular or axillary adenopathy.     Left upper body: No supraclavicular or axillary adenopathy.     Lower Body: No right inguinal adenopathy. No left inguinal adenopathy.  Skin:    General: Skin is warm and dry.     Coloration: Skin is not jaundiced.     Findings: No rash.  Neurological:     Mental Status: She is alert and oriented to person, place, and time.     Cranial Nerves: No cranial nerve deficit.  Psychiatric:        Mood and Affect: Mood normal.        Behavior: Behavior normal.        Thought Content: Thought content normal.    LABS:   CBC Latest Ref Rng & Units 02/05/2021 11/06/2020 08/07/2020  WBC - 5.7 5.5 5.7  Hemoglobin 12.0 - 16.0 12.5 12.1 12.9  Hematocrit 36 - 46 36 35(A) 36.8  Platelets 150 - 399 292 366 408(H)   CMP 07/29/2007 07/28/2007 07/27/2007  Glucose 130(H) 117(H) 112(H)  BUN _0 Creatinine 0.59 0.51 0.53  Sodium 134(L) 134(L) 133(L)  Potassium 4.4 4.0 DELTA CHECK NOTED 3.3(L)  Chloride 100 99 96  CO2 _1 Calcium 8.7 8.9 9.2  Total Protein - - 6.5  Total Bilirubin - - 1.0  Alkaline Phos - - 134(H)  AST - - 30  ALT - - 50(H)     No results found for: CEA1 / No results found for: CEA1 No results found for: PSA1 No results found for: YTW446 No results found for: KMM381  No results found for: TOTALPROTELP, ALBUMINELP, A1GS, A2GS, BETS, BETA2SER, GAMS, MSPIKE, SPEI No results found for: TIBC, FERRITIN, IRONPCTSAT No results found for:  LDH  STUDIES:  No results found.    HISTORY:   Past Medical History:  Diagnosis Date  . Breast cancer ([place]) 05/02/2020  . Hypertension   . Osteoarthritis     Past Surgical History:  Procedure Laterality Date  . APPENDECTOMY    . BREAST LUMPECTOMY Right 06/10/2020  . REPLACEMENT TOTAL KNEE Right   .  TUBAL LIGATION Bilateral   . UMBILICAL HERNIA REPAIR      Family History  Problem Relation Age of Onset  . Breast cancer Mother   . Skin cancer Mother   . Uterine cancer Mother   . Lymphoma Mother     Social History:  reports that she has never smoked. She has never used smokeless tobacco. She reports that she does not drink alcohol and does not use drugs.The patient is alone today.  Allergies:  Allergies  Allergen Reactions  . Morphine Nausea And Vomiting and Nausea Only    Current Medications: Current Outpatient Medications  Medication Sig Dispense Refill  . anastrozole (ARIMIDEX) 1 MG tablet Take 1 tablet (1 mg total) by mouth daily. 90 tablet 3  . aspirin 325 MG tablet Take 1 tablet by mouth daily.    . benazepril-hydrochlorthiazide (LOTENSIN HCT) 20-25 MG tablet Take 1 tablet by mouth daily.    . carvedilol (COREG) 12.5 MG tablet Take 12.5 mg by mouth 2 (two) times daily.    . furosemide (LASIX) 20 MG tablet Take 1 tablet by mouth daily.    . hydroxyurea (HYDREA) 500 MG capsule Take 1 capsule by mouth twice daily 60 capsule 5  . KLOR-CON M20 20 MEQ tablet Take 20 mEq by mouth 4 (four) times daily. Takes 79mq (4 per day)    . metFORMIN (GLUCOPHAGE) 1000 MG tablet Take 500 mg by mouth 2 (two) times daily. Per patient PCP lowered dose to 5036mBID. 11/06/20    . Multiple Vitamin (MULTIVITAMIN) tablet Take 1 tablet by mouth daily.    . niacin 500 MG tablet Take 500 mg by mouth daily.    . Marland Kitchenmega-3 fish oil (MAXEPA) 1000 MG CAPS capsule Take 2 tablets by mouth daily.    . simvastatin (ZOCOR) 40 MG tablet Take 40 mg by mouth at bedtime.     No current  facility-administered medications for this visit.     ASSESSMENT & PLAN:   Assessment:  1. Essential thrombocytosis, which remains well controlled with hydroxyurea 1000 mg daily, which she will continue.  2. Stage IA hormone right breast diagnosed in August 2021.  She has increased firmness along the lumpectomy incision most likely a recurrent seroma. I will obtain a bilateral diagnostic mammogram and right breast ultrasound to confirm, as her last bilateral mammogram was in May 2021. She continues to tolerate anastrazole well.   Plan:  We will obtain mammogram and ultrasound as above. She knows to continue hydroxyurea and anastrozole daily. We will then see her back in 3 months with a CBC and comprehensive metabolic panel.  The patient understands the plans discussed today and is in agreement with them.  She knows to contact our office if she develops concerns prior to her next appointment.     KeMarvia PicklesPA-C

## 2021-02-05 NOTE — Telephone Encounter (Signed)
Per 5/18 LOS, patient scheduled for June Mammo/U/S - Aug Appt's.  Gave patient Appt Summary/Orders

## 2021-03-03 ENCOUNTER — Encounter: Payer: Self-pay | Admitting: Hematology and Oncology

## 2021-04-10 ENCOUNTER — Other Ambulatory Visit: Payer: Self-pay | Admitting: Hematology and Oncology

## 2021-04-10 DIAGNOSIS — D473 Essential (hemorrhagic) thrombocythemia: Secondary | ICD-10-CM

## 2021-05-08 ENCOUNTER — Telehealth: Payer: Self-pay | Admitting: Oncology

## 2021-05-08 ENCOUNTER — Inpatient Hospital Stay: Payer: Medicare HMO | Admitting: Hematology and Oncology

## 2021-05-08 ENCOUNTER — Other Ambulatory Visit: Payer: Self-pay

## 2021-05-08 ENCOUNTER — Encounter: Payer: Self-pay | Admitting: Hematology and Oncology

## 2021-05-08 ENCOUNTER — Inpatient Hospital Stay: Payer: Medicare HMO | Attending: Oncology

## 2021-05-08 VITALS — BP 152/69 | HR 89 | Temp 98.4°F | Resp 20 | Ht 61.0 in | Wt 225.9 lb

## 2021-05-08 DIAGNOSIS — C50111 Malignant neoplasm of central portion of right female breast: Secondary | ICD-10-CM

## 2021-05-08 DIAGNOSIS — Z170 Estrogen receptor positive status [ER+]: Secondary | ICD-10-CM

## 2021-05-08 LAB — HEPATIC FUNCTION PANEL
ALT: 29 (ref 7–35)
AST: 36 — AB (ref 13–35)
Alkaline Phosphatase: 81 (ref 25–125)
Bilirubin, Total: 0.8

## 2021-05-08 LAB — BASIC METABOLIC PANEL
BUN: 31 — AB (ref 4–21)
CO2: 26 — AB (ref 13–22)
Chloride: 96 — AB (ref 99–108)
Creatinine: 1 (ref 0.5–1.1)
Glucose: 306
Potassium: 3.8 (ref 3.4–5.3)
Sodium: 135 — AB (ref 137–147)

## 2021-05-08 LAB — CBC AND DIFFERENTIAL
HCT: 34 — AB (ref 36–46)
Hemoglobin: 12.2 (ref 12.0–16.0)
Neutrophils Absolute: 3.05
Platelets: 256 (ref 150–399)
WBC: 5

## 2021-05-08 LAB — COMPREHENSIVE METABOLIC PANEL
Albumin: 4.3 (ref 3.5–5.0)
Calcium: 9.7 (ref 8.7–10.7)

## 2021-05-08 LAB — CBC: RBC: 2.96 — AB (ref 3.87–5.11)

## 2021-05-08 NOTE — Telephone Encounter (Signed)
Per 8/18 LOS, patient scheduled for Nov Appt's.  Gave patient Appt Summary

## 2021-05-08 NOTE — Progress Notes (Signed)
Post  [address] 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  Current Treatment:   Hydroxyurea 1000 mg daily, as well as anastrozole 1 mg daily  HISTORY OF PRESENT ILLNESS:  Laura [name] is a 70 y.o. 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  She was occasionally treated with pulse busulfan, but then was lost to follow-up in 2011. She was referred back in March 2014, when her platelet count reached 1.4 million. She has been on hydroxyurea 1000 mg daily.  She was lost to follow-up again from May 2017 to July 2018.  She continued on hydroxyurea during that time.  She had worsening thrombocythemia, so she had increased the hydroxyurea 1000 mg daily alternating with 1500 mg daily.  In October 2018, the platelet count was down to 337,000, so we decreased the hydroxyurea to a 1000 mg daily.  Her platelet count was up to 533,000 in January 2019 at Dr. Unk Lightning office, so the hydroxyurea was increased to 1500 mg daily. Her platelets remained controlled on that dose. She has had mild renal insufficiency. She has hypokalemia, for which she was taking potassium chloride 20 mEq 3 times daily.  She has had chronic elevation of the liver transaminases since 2014, felt to be most likely due to medications.    When she was seen for routine follow-up in October 2019 she had anemia and her platelet count had dropped to 154,000.  Hydroxyurea was placed on hold and we followed her CBC closely.  The anemia slowly resolved and platelet count slowly increased and was up to 568,000 in January 2020.  At that time, we resumed hydroxyurea 500 mg daily.  In February, her platelets  remained elevated at 743,000, so hydroxyurea was increased to 1000 mg.  She has done well since that time with her platelets running under 500,000.   Due to her family history of malignancy, we had previously discussed genetic testing, but she had declined, as her sister's testing was negative.  Due to her family history of colon cancer, she has been undergoing colonoscopy at least every 5 years. The patient's mother had endometrial cancer at age 5 and breast cancer at age 39, then developed non-Hodgkin's lymphoma at age 52. Her maternal grandmother had breast cancer at age 69. Her maternal grandfather had lung cancer at unknown age.  Her sister had a history of colon cancer at age 42 and then developed a primary breast cancer at age 80. Unfortunately, her sister died of metastatic breast cancer.  She states that her sister underwent genetic testing, which was apparently negative, but she is not sure what testing her sister had done.  Her brother had head and neck cancer at unknown age.  Her father had skin melanoma at age 45. A paternal aunt had breast cancer at age 35.   In May 2021, she was found to have a clinical stage IA (T1b N0 M0) hormone receptor positive breast cancer.  Annual screening bilateral mammogram in May 2021 revealed a possible mass and an area of calcifications of the right breast.  No findings suspicious for malignancy were found within the left breast.   She finally underwent right diagnostic mammogram and  ultrasound in August.  This revealed a 0.9 cm mass in the medial right breast, as well as an indeterminate group of coarse heterogeneous calcifications measuring 0.6 cm in the upper outer right breast.  She underwent biopsy of these areas.  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  Estrogen and progesterone receptors were highly positive at 100%, and HER2/neu negative.  Ki 67 was 1%.  The biopsy of  the area of calcifications revealed fibro-adenomatoid changes with calcifications.  She underwent testing for hereditary cancer syndromes with the [facility] Hereditary Cancer Panel test in August 2021.  This did not reveal any clinically significant mutation or variants of uncertain significance. She was treated with lumpectomy and sentinel lymph node biopsy in September. Surgical pathology from this procedure confirmed invasive ductal carcinoma, grade 1, and low grade ductal carcinoma in situ, measuring a total of 9 mm. Margins were negative for malignancy. 1 sentinel lymph node was negative for metastasis, so pathologic stage IA (pT1b pN0).  She received adjuvant radiation therapy to the right breast. Bone density scan in November was normal.  She was placed on anastrozole 1 mg daily in November.  INTERVAL HISTORY:  Laura [name] is here today for repeat clinical assessment and states she continues hydroxyurea 1000 mg daily.  She continues anastrozole daily without significant difficulty.  She has been well since her last visit. Recent mammogram in June was benign. She notes the area of concern at the incision site is improved. She denies fever, chills, nausea or vomiting. She denies shortness of breath, chest pain or cough. She denies issue with bowel or bladder. CBC and CMP are unremarkable today.  REVIEW OF SYSTEMS:  Review of Systems  Constitutional:  Negative for appetite change, chills, fatigue, fever and unexpected weight change.  HENT:   Negative for lump/mass, mouth sores and sore throat.   Respiratory:  Negative for cough and shortness of breath.   Cardiovascular:  Negative for chest pain and leg swelling.  Gastrointestinal:  Negative for abdominal pain, constipation, diarrhea, nausea and vomiting.  Endocrine: Positive for hot flashes.  Genitourinary:  Negative for difficulty urinating, dysuria, frequency and hematuria.   Musculoskeletal:  Positive for arthralgias (Left knee pain). Negative for  back pain and myalgias.  Skin:  Negative for rash.  Neurological:  Negative for dizziness and headaches.  Hematological:  Negative for adenopathy. Does not bruise/bleed easily.  Psychiatric/Behavioral:  Negative for depression and sleep disturbance. The patient is not nervous/anxious.     VITALS:  Blood pressure (!) 152/69, pulse 89, temperature 98.4 F (36.9 C), temperature source Oral, resp. rate 20, height '5\' 1"'  (1.549 m), weight 225 lb 14.4 oz (102.5 kg), SpO2 97 %.  Wt Readings from Last 3 Encounters:  05/08/21 225 lb 14.4 oz (102.5 kg)  02/05/21 223 lb 1.6 oz (101.2 kg)  11/06/20 222 lb 3.2 oz (100.8 kg)    Body mass index is 42.68 kg/m.  Performance status (ECOG): 0 - Asymptomatic  PHYSICAL EXAM:  Physical Exam Vitals and nursing note reviewed.  Constitutional:      General: She is not in acute distress.    Appearance: Normal appearance.  HENT:     Head: Normocephalic and atraumatic.     Mouth/Throat:     Mouth: Mucous membranes are moist.     Pharynx: Oropharynx is clear. No oropharyngeal exudate or posterior oropharyngeal erythema.  Eyes:     General: No scleral icterus.  Extraocular Movements: Extraocular movements intact.     Conjunctiva/sclera: Conjunctivae normal.     Pupils: Pupils are equal, round, and reactive to light.  Cardiovascular:     Rate and Rhythm: Normal rate and regular rhythm.     Heart sounds: Normal heart sounds. No murmur heard.   No friction rub. No gallop.  Pulmonary:     Effort: Pulmonary effort is normal.     Breath sounds: Normal breath sounds. No wheezing, rhonchi or rales.  Chest:  Breasts:    Right: No bleeding, inverted nipple, mass, nipple discharge, skin change or tenderness.     Left: Normal. No swelling, bleeding, inverted nipple, mass, nipple discharge, skin change or tenderness.     Comments: Minimal hardening at incision site consistent with surgical changes. Abdominal:     General: There is no distension.      Palpations: Abdomen is soft. There is no hepatomegaly, splenomegaly or mass.     Tenderness: There is no abdominal tenderness.  Musculoskeletal:        General: Normal range of motion.     Cervical back: Normal range of motion and neck supple. No tenderness.     Right lower leg: No edema.     Left lower leg: No edema.  Lymphadenopathy:     Cervical: No cervical adenopathy.     Upper Body:     Right upper body: No supraclavicular or axillary adenopathy.     Left upper body: No supraclavicular or axillary adenopathy.     Lower Body: No right inguinal adenopathy. No left inguinal adenopathy.  Skin:    General: Skin is warm and dry.     Coloration: Skin is not jaundiced.     Findings: No rash.  Neurological:     Mental Status: She is alert and oriented to person, place, and time.     Cranial Nerves: No cranial nerve deficit.  Psychiatric:        Mood and Affect: Mood normal.        Behavior: Behavior normal.        Thought Content: Thought content normal.   LABS:   CBC Latest Ref Rng & Units 05/08/2021 02/05/2021 11/06/2020  WBC - 5.0 5.7 5.5  Hemoglobin 12.0 - 16.0 12.2 12.5 12.1  Hematocrit 36 - 46 34(A) 36 35(A)  Platelets 150 - 399 256 292 366   CMP Latest Ref Rng & Units 05/08/2021 07/29/2007 07/28/2007  Glucose - - 130(H) 117(H)  BUN 4 - 21 31(A) 13 14  Creatinine 0.5 - 1.1 1.0 0.59 0.51  Sodium 137 - 147 135(A) 134(L) 134(L)  Potassium 3.4 - 5.3 3.8 4.4 4.0 DELTA CHECK NOTED  Chloride 99 - 108 96(A) 100 99  CO2 13 - 22 26(A) 28 27  Calcium 8.7 - 10.7 9.7 8.7 8.9  Total Protein - - - -  Total Bilirubin - - - -  Alkaline Phos 25 - 125 81 - -  AST 13 - 35 36(A) - -  ALT 7 - 35 29 - -     No results found for: CEA1 / No results found for: CEA1 No results found for: PSA1 No results found for: ION629 No results found for: BMW413  No results found for: TOTALPROTELP, ALBUMINELP, A1GS, A2GS, BETS, BETA2SER, GAMS, MSPIKE, SPEI No results found for: TIBC, FERRITIN,  IRONPCTSAT No results found for: LDH  STUDIES:  Exam(s): 2440-1027 MAM/MAM DIGITAL W/TOMO DIAG B CLINICAL DATA:  70 year old female presenting for routine annual surveillance status post  right breast lumpectomy in September of 2021.  EXAM: DIGITAL DIAGNOSTIC BILATERAL MAMMOGRAM WITH TOMOSYNTHESIS AND CAD  TECHNIQUE: Bilateral digital diagnostic mammography and breast tomosynthesis was performed. The images were evaluated with computer-aided detection.  COMPARISON:  Previous exam(s).  ACR Breast Density Category c: The breast tissue is heterogeneously dense, which may obscure small masses.  FINDINGS: Expected surgical changes in the right breast consistent with history of lumpectomy. No suspicious calcifications, masses or areas of distortion are seen in the bilateral breasts.  IMPRESSION: 1. Expected surgical changes in the right breast consistent with history of lumpectomy.  2.  No mammographic evidence of malignancy in the bilateral breasts.  RECOMMENDATION: Diagnostic mammogram is suggested in 1 year. (Code:DM-B-01Y)  I have discussed the findings and recommendations with the patient. If applicable, a reminder letter will be sent to the patient regarding the next appointment.  BI-RADS CATEGORY  2: Benign.   Electronically Signed   By: Ammie Ferrier M.D.   On: 02/27/2021 15:17  Electronically Signed By: Ammie Ferrier MD  Electronically Signed Date/Time: 06/09/221520 Dictate Date/Time: 02/27/21 1511  HISTORY:   Past Medical History:  Diagnosis Date   Breast cancer ([place]) 05/02/2020   Hypertension    Osteoarthritis     Past Surgical History:  Procedure Laterality Date   APPENDECTOMY     BREAST LUMPECTOMY Right 06/10/2020   REPLACEMENT TOTAL KNEE Right    TUBAL LIGATION Bilateral    UMBILICAL HERNIA REPAIR      Family History  Problem Relation Age of Onset   Breast cancer Mother    Skin cancer Mother    Uterine cancer Mother    Lymphoma  Mother     Social History:  reports that she has never smoked. She has never used smokeless tobacco. She reports that she does not drink alcohol and does not use drugs.The patient is alone today.  Allergies:  Allergies  Allergen Reactions   Morphine Nausea And Vomiting and Nausea Only    Current Medications: Current Outpatient Medications  Medication Sig Dispense Refill   anastrozole (ARIMIDEX) 1 MG tablet Take 1 tablet (1 mg total) by mouth daily. 90 tablet 3   aspirin 325 MG tablet Take 1 tablet by mouth daily.     benazepril-hydrochlorthiazide (LOTENSIN HCT) 20-25 MG tablet Take 1 tablet by mouth daily.     carvedilol (COREG) 12.5 MG tablet Take 12.5 mg by mouth 2 (two) times daily.     furosemide (LASIX) 20 MG tablet Take 1 tablet by mouth daily.     hydroxyurea (HYDREA) 500 MG capsule Take 1 capsule by mouth twice daily 180 capsule 1   KLOR-CON M20 20 MEQ tablet Take 20 mEq by mouth 4 (four) times daily. Takes 15mq (4 per day)     metFORMIN (GLUCOPHAGE) 1000 MG tablet Take 500 mg by mouth 2 (two) times daily. Per patient PCP lowered dose to 5080mBID. 11/06/20     Multiple Vitamin (MULTIVITAMIN) tablet Take 1 tablet by mouth daily.     niacin 500 MG tablet Take 500 mg by mouth daily.     omega-3 fish oil (MAXEPA) 1000 MG CAPS capsule Take 2 tablets by mouth daily.     simvastatin (ZOCOR) 40 MG tablet Take 40 mg by mouth at bedtime.     No current facility-administered medications for this visit.     ASSESSMENT & PLAN:   Assessment:  1. Essential thrombocytosis, which remains well controlled with hydroxyurea 1000 mg daily, which she will continue.   2. Stage  IA hormone right breast diagnosed in August 2021.  Mammogram from June with benign findings, recommend yearly imaging. She continues to follow with surgery. She will continue anastrazole.   Plan:  We will see her back in 3 months for repeat evaluation. She does not require any refills today.   She verbalizes  understanding of and agreement to the plans discussed today. She knows to call the office should any new questions or concerns arise.     Melodye Ped, NP

## 2021-08-01 NOTE — Progress Notes (Signed)
[facility]  [address] [place],  [place]  [address] [phone]  Clinic Day:  08/08/2021  Referring physician: Myrlene Broker, MD  This document serves as a record of services personally performed by Hosie Poisson, MD. It was created on their behalf by [facility], a trained medical scribe. The creation of this record is based on the scribe's personal observations and the provider's statements to them.  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  She was occasionally treated with pulse busulfan, but then was lost to follow-up in 2011. She was referred back in March 2014, when her platelet count reached 1.4 million. She has been on hydroxyurea 1000 mg daily.  She was lost to follow-up again from May 2017 to July 2018.  She continued on hydroxyurea during that time.  She had worsening thrombocythemia, so she had increased the hydroxyurea 1000 mg daily alternating with 1500 mg daily.  In October 2018, the platelet count was down to 337,000, so we decreased the hydroxyurea to a 1000 mg daily.  Her platelet count was up to 533,000 in January 2019 at Dr. Unk Lightning office, so the hydroxyurea was increased to 1500 mg daily. Her platelets remained controlled on that dose. She has had mild renal insufficiency. She had hypokalemia, for which she was taking potassium chloride 20 mEq 3 times daily.  She has had chronic elevation of the liver transaminases since 2014, felt to be most likely due to medications and/or hepatic steatosis.    When she was seen for routine follow-up in October 2019 she had anemia and her platelet count had dropped to  154,000.  Hydroxyurea was placed on hold and we followed her CBC closely.  The anemia slowly resolved and platelet count slowly increased and was up to 568,000 in January 2020.  At that time, we resumed hydroxyurea 500 mg daily.  In February, her platelets remained elevated at 743,000, so hydroxyurea was increased to 1000 mg.  She has done well since that time with her platelets running under 500,000.   Due to her family history of malignancy, we had previously discussed genetic testing, but she had declined, as her sister's testing was negative.  Due to her family history of colon cancer, she has been undergoing colonoscopy at least every 5 years. The patient's mother had endometrial cancer at age 46 and breast cancer at age 8, then developed non-Hodgkin's lymphoma at age 37. Her maternal grandmother had breast cancer at age 78. Her maternal grandfather had lung cancer at unknown age.  Her sister had a history of colon cancer at age 37 and then developed a primary breast cancer at age 49. Unfortunately, her sister died of metastatic breast cancer.  She states that her sister underwent genetic testing, which was apparently negative, but she is not sure what testing her sister had done.  Her brother had head and neck cancer at unknown age.  Her father had skin melanoma at age 68. A paternal aunt had breast cancer at age 41.   In May 2021, she was found to have a clinical stage IA (T1b N0 M0) hormone receptor positive breast cancer.  Annual screening  bilateral mammogram in May 2021 revealed a possible mass and an area of calcifications of the right breast.  No findings suspicious for malignancy were found within the left breast.   She had a right diagnostic mammogram and ultrasound in August.  This revealed a 0.9 cm mass in the medial right breast, as well as an indeterminate group of coarse heterogeneous calcifications measuring 0.6 cm in the upper outer right breast. 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  Estrogen and progesterone receptors were highly positive at 100%, and HER2/neu negative.  Ki 67 was 1%.  The biopsy of the area of calcifications revealed fibro-adenomatoid changes with calcifications.  She underwent testing for hereditary cancer syndromes with the [facility] Hereditary Cancer Panel test in August 2021.  This did not reveal any clinically significant mutation or variants of uncertain significance. She was treated with lumpectomy and sentinel lymph node biopsy in September. Surgical pathology from this procedure confirmed invasive ductal carcinoma, grade 1, and low grade ductal carcinoma in situ, measuring a total of 9 mm. Margins were negative for malignancy. 1 sentinel lymph node was negative for metastasis, so pathologic stage IA (pT1b pN0).  She received adjuvant radiation therapy to the right breast. Bone density scan in November was normal.  She was placed on anastrozole 1 mg daily in November 2021. Annual mammogram from June 2022 was clear.  INTERVAL HISTORY:  Laura [name] is here for routine follow up and states that she has been fairly well. She does have occasional fatigue in the evenings. She continues hydroxyurea 500 mg BID and anastrozole daily without difficulty. Blood counts are unremarkable except for a hemoglobin of 11.8. She may decrease her dose of hydroxyurea to 500 mg daily. She states that she is comfortable to alternate from 500 mg daily to 1000 mg daily. Her  appetite is good, and she has lost 3 1/2 pounds since her last visit.  She denies fever, chills or other signs of infection.  She denies nausea, vomiting, bowel issues, or abdominal pain.  She denies sore throat, cough, dyspnea, or chest pain.  REVIEW OF SYSTEMS:  Review of Systems  Constitutional:  Positive for fatigue (in the evenings). Negative for appetite change, chills, fever and unexpected weight change.  HENT:  Negative.     Eyes: Negative.   Respiratory: Negative.  Negative for chest tightness, cough, hemoptysis, shortness of breath and wheezing.   Cardiovascular: Negative.  Negative for chest pain, leg swelling and palpitations.  Gastrointestinal: Negative.  Negative for abdominal distention, abdominal pain, blood in stool, constipation, diarrhea, nausea and vomiting.  Endocrine: Negative.   Genitourinary: Negative.  Negative for difficulty urinating, dysuria, frequency and hematuria.   Musculoskeletal: Negative.  Negative for arthralgias, back pain, flank pain, gait problem and myalgias.  Skin: Negative.   Neurological: Negative.  Negative for dizziness, extremity weakness, gait problem, headaches, light-headedness, numbness, seizures and speech difficulty.  Hematological: Negative.   Psychiatric/Behavioral: Negative.  Negative for depression and sleep disturbance. The patient is not nervous/anxious.     VITALS:  Blood pressure 134/69, pulse 85, temperature 98.3 F (36.8 C), temperature source Oral, resp. rate 18, height '5\' 1"'  (1.549 m), weight 222 lb 3.2 oz (100.8 kg), SpO2 99 %.  Wt Readings from Last 3 Encounters:  08/08/21 222 lb 3.2 oz (100.8 kg)  05/08/21 225 lb 14.4 oz (102.5 kg)  02/05/21 223 lb 1.6 oz (101.2 kg)    Body mass  index is 41.98 kg/m.  Performance status (ECOG): 0 - Asymptomatic  PHYSICAL EXAM:  Physical Exam Constitutional:      General: She is not in acute distress.    Appearance: Normal appearance. She is normal weight.  HENT:     Head: Normocephalic and atraumatic.  Eyes:     General: No scleral icterus.    Extraocular Movements: Extraocular movements intact.     Conjunctiva/sclera: Conjunctivae normal.     Pupils: Pupils are equal, round, and reactive to light.  Cardiovascular:     Rate and Rhythm: Normal rate and regular rhythm.     Pulses: Normal pulses.     Heart sounds: Normal heart sounds. No murmur heard.   No friction rub. No gallop.  Pulmonary:     Effort:  Pulmonary effort is normal. No respiratory distress.     Breath sounds: Normal breath sounds.  Chest:     Comments: She has a well healed scar in the upper inner quadrant of the right breast and right axilla. No masses in either breast. Abdominal:     General: Bowel sounds are normal. There is no distension.     Palpations: Abdomen is soft. There is no hepatomegaly, splenomegaly or mass.     Tenderness: There is no abdominal tenderness.  Musculoskeletal:        General: Normal range of motion.     Cervical back: Normal range of motion and neck supple.     Right lower leg: No edema.     Left lower leg: No edema.  Lymphadenopathy:     Cervical: No cervical adenopathy.  Skin:    General: Skin is warm and dry.  Neurological:     General: No focal deficit present.     Mental Status: She is alert and oriented to person, place, and time. Mental status is at baseline.  Psychiatric:        Mood and Affect: Mood normal.        Behavior: Behavior normal.        Thought Content: Thought content normal.        Judgment: Judgment normal.   LABS:   CBC Latest Ref Rng & Units 08/08/2021 05/08/2021 02/05/2021  WBC - 4.8 5.0 5.7  Hemoglobin 12.0 - 16.0 11.8(A) 12.2 12.5  Hematocrit 36 - 46 34(A) 34(A) 36  Platelets 150 - 399 198 256 292   CMP Latest Ref Rng & Units 08/08/2021 05/08/2021 07/29/2007  Glucose - - - 130(H)  BUN 4 - 21 21 31(A) 13  Creatinine 0.5 - 1.1 0.9 1.0 0.59  Sodium 137 - 147 134(A) 135(A) 134(L)  Potassium 3.4 - 5.3 3.6 3.8 4.4  Chloride 99 - 108 96(A) 96(A) 100  CO2 13 - 22 28(A) 26(A) 28  Calcium 8.7 - 10.7 9.5 9.7 8.7  Total Protein - - - -  Total Bilirubin - - - -  Alkaline Phos 25 - 125 103 81 -  AST 13 - 35 36(A) 36(A) -  ALT 7 - 35 30 29 -    STUDIES:   EXAM: 02/27/2021 DIGITAL DIAGNOSTIC BILATERAL MAMMOGRAM WITH TOMOSYNTHESIS AND CAD  TECHNIQUE: Bilateral digital diagnostic mammography and breast tomosynthesis was performed. The images were evaluated  with computer-aided detection.  COMPARISON: Previous exam(s).  ACR Breast Density Category c: The breast tissue is heterogeneously dense, which may obscure small masses.  FINDINGS: Expected surgical changes in the right breast consistent with history of lumpectomy. No suspicious calcifications, masses or areas of distortion are  seen in the bilateral breasts.  IMPRESSION: 1. Expected surgical changes in the right breast consistent with history of lumpectomy. 2. No mammographic evidence of malignancy in the bilateral breasts.  HISTORY:   Allergies:  Allergies  Allergen Reactions   Morphine Nausea And Vomiting and Nausea Only    Current Medications: Current Outpatient Medications  Medication Sig Dispense Refill   anastrozole (ARIMIDEX) 1 MG tablet Take 1 tablet by mouth once daily 90 tablet 3   aspirin 325 MG tablet Take 1 tablet by mouth daily.     benazepril-hydrochlorthiazide (LOTENSIN HCT) 20-25 MG tablet Take 1 tablet by mouth daily.     carvedilol (COREG) 12.5 MG tablet Take 12.5 mg by mouth 2 (two) times daily.     furosemide (LASIX) 20 MG tablet Take 1 tablet by mouth daily.     hydroxyurea (HYDREA) 500 MG capsule Take 1 capsule by mouth twice daily 180 capsule 1   KLOR-CON M20 20 MEQ tablet Take 20 mEq by mouth 4 (four) times daily. Takes 69mq (4 per day)     metFORMIN (GLUCOPHAGE) 1000 MG tablet Take 500 mg by mouth 2 (two) times daily. Per patient PCP lowered dose to 5046mBID. 11/06/20     Multiple Vitamin (MULTIVITAMIN) tablet Take 1 tablet by mouth daily.     niacin 500 MG tablet Take 500 mg by mouth daily.     omega-3 fish oil (MAXEPA) 1000 MG CAPS capsule Take 2 tablets by mouth daily.     simvastatin (ZOCOR) 40 MG tablet Take 40 mg by mouth at bedtime.     No current facility-administered medications for this visit.     ASSESSMENT & PLAN:   Assessment:  1. Essential thrombocytosis, which remains well controlled with hydroxyurea 1000 mg daily. As here  counts have remained in a good range, she will start alternating 500 mg and 1000 mg daily.   2. Stage IA hormone right breast diagnosed in August 2021.  Mammogram from June remains without evidence of malignancy. She continues to follow with surgery. She will continue with daily anastrozole.   3. Anemia, mild. We will continue to monitor.  Plan:   She will start alternating hydroxyurea 500 mg and 1000 mg daily. She knows to continue anastrozole daily. We will see her back in 3-4 months for repeat evaluation. She verbalizes understanding of and agreement to the plans discussed today. She knows to call the office should any new questions or concerns arise.   I provided 20 minutes of face-to-face time during this this encounter and > 50% was spent counseling as documented under my assessment and plan.    I, LaRita Oharaam acting as scribe for ChDerwood KaplanMD  I have reviewed this report as typed by the medical scribe, and it is complete and accurate.

## 2021-08-04 ENCOUNTER — Other Ambulatory Visit: Payer: Self-pay | Admitting: Oncology

## 2021-08-07 ENCOUNTER — Other Ambulatory Visit: Payer: Self-pay | Admitting: Oncology

## 2021-08-07 DIAGNOSIS — Z170 Estrogen receptor positive status [ER+]: Secondary | ICD-10-CM

## 2021-08-08 ENCOUNTER — Encounter: Payer: Self-pay | Admitting: Oncology

## 2021-08-08 ENCOUNTER — Inpatient Hospital Stay: Payer: Medicare HMO | Admitting: Oncology

## 2021-08-08 ENCOUNTER — Other Ambulatory Visit: Payer: Self-pay

## 2021-08-08 ENCOUNTER — Other Ambulatory Visit: Payer: Self-pay | Admitting: Oncology

## 2021-08-08 ENCOUNTER — Inpatient Hospital Stay: Payer: Medicare HMO | Attending: Oncology

## 2021-08-08 DIAGNOSIS — D473 Essential (hemorrhagic) thrombocythemia: Secondary | ICD-10-CM

## 2021-08-08 DIAGNOSIS — T451X5A Adverse effect of antineoplastic and immunosuppressive drugs, initial encounter: Secondary | ICD-10-CM | POA: Diagnosis not present

## 2021-08-08 DIAGNOSIS — D6481 Anemia due to antineoplastic chemotherapy: Secondary | ICD-10-CM | POA: Insufficient documentation

## 2021-08-08 DIAGNOSIS — Z170 Estrogen receptor positive status [ER+]: Secondary | ICD-10-CM

## 2021-08-08 HISTORY — DX: Anemia due to antineoplastic chemotherapy: D64.81

## 2021-08-08 LAB — BASIC METABOLIC PANEL
BUN: 21 (ref 4–21)
CO2: 28 — AB (ref 13–22)
Chloride: 96 — AB (ref 99–108)
Creatinine: 0.9 (ref 0.5–1.1)
Glucose: 184
Potassium: 3.6 (ref 3.4–5.3)
Sodium: 134 — AB (ref 137–147)

## 2021-08-08 LAB — HEPATIC FUNCTION PANEL
ALT: 30 (ref 7–35)
AST: 36 — AB (ref 13–35)
Alkaline Phosphatase: 103 (ref 25–125)
Bilirubin, Total: 0.9

## 2021-08-08 LAB — CBC: RBC: 2.86 — AB (ref 3.87–5.11)

## 2021-08-08 LAB — CBC AND DIFFERENTIAL
HCT: 34 — AB (ref 36–46)
Hemoglobin: 11.8 — AB (ref 12.0–16.0)
Neutrophils Absolute: 2.3
Platelets: 198 (ref 150–399)
WBC: 4.8

## 2021-08-08 LAB — COMPREHENSIVE METABOLIC PANEL
Albumin: 4.2 (ref 3.5–5.0)
Calcium: 9.5 (ref 8.7–10.7)

## 2021-10-19 ENCOUNTER — Other Ambulatory Visit: Payer: Self-pay | Admitting: Hematology and Oncology

## 2021-10-19 DIAGNOSIS — D473 Essential (hemorrhagic) thrombocythemia: Secondary | ICD-10-CM

## 2021-12-03 ENCOUNTER — Telehealth: Payer: Self-pay | Admitting: Oncology

## 2021-12-03 NOTE — Telephone Encounter (Signed)
12/03/21 Voicemail full,Unable to leave msg ?

## 2021-12-25 NOTE — Progress Notes (Signed)
?[facility]  ?[address] ?[place],  [place]  [address] ?[phone] ? ?Clinic Day:  12/26/21 ? ?Referring physician: Myrlene Broker, MD ? ?CHIEF COMPLAINT:  ?CC:   History of essential thrombocytosis, as well as stage I A hormone receptor positive breast cancer  ? ?Current Treatment:   Hydroxyurea 1000 mg daily, as well as anastrozole 1 mg daily ? ?HISTORY OF PRESENT ILLNESS:  ?Laura [name] is a 71 y.o. 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  She was occasionally treated with pulse busulfan, but then was lost to follow-up in 2011. She was referred back in March 2014, when her platelet count reached 1.4 million. She has been on hydroxyurea 1000 mg daily.  She was lost to follow-up again from May 2017 to July 2018.  She continued on hydroxyurea during that time.  She had worsening thrombocythemia, so she had increased the hydroxyurea 1000 mg daily alternating with 1500 mg daily.  In October 2018, the platelet count was down to 337,000, so we decreased the hydroxyurea to a 1000 mg daily.  Her platelet count was up to 533,000 in January 2019 at Dr. Unk Lightning office, so the hydroxyurea was increased to 1500 mg daily. Her platelets remained controlled on that dose. She has had mild renal insufficiency. She had hypokalemia, for which she was taking potassium chloride 20 mEq 3 times daily.  She has had chronic elevation of the liver transaminases since 2014, felt to be most likely due to medications and/or hepatic steatosis.  When she was seen for routine follow-up in October 2019 she had anemia and her platelet count had dropped to 154,000.  Hydroxyurea was placed on hold and we followed her CBC closely.  The anemia slowly resolved and platelet count slowly increased and was up to 568,000 in January 2020.  At that time, we resumed hydroxyurea 500 mg daily.  In February, her platelets remained elevated at 743,000, so  hydroxyurea was increased to 1000 mg.  She has done well since that time with her platelets running under 500,000.  ? ?Due to her family history of malignancy, we had previously discussed genetic testing, but she had declined, as her sister's testing was negative.  Due to her family history of colon cancer, she has been undergoing colonoscopy at least every 5 years. The patient's mother had endometrial cancer at age 18 and breast cancer at age 33, then developed non-Hodgkin's lymphoma at age 54. Her maternal grandmother had breast cancer at age 85. Her maternal grandfather had lung cancer at unknown age.  Her sister had a history of colon cancer at age 58 and then developed a primary breast cancer at age 24. Unfortunately, her sister died of metastatic breast cancer.  She states that her sister underwent genetic testing, which was apparently negative, but she is not sure what testing her sister had done.  Her brother had head and neck cancer at unknown age.  Her father had skin melanoma at age 45. A paternal aunt had breast cancer at age 36. ?  ?In May 2021, she was found to have a clinical stage IA (T1b N0 M0) hormone receptor positive breast cancer.  Annual screening bilateral mammogram in May 2021 revealed a possible mass and an area of calcifications of the right breast.  No findings suspicious for malignancy were found within the left breast.   She had a right diagnostic mammogram and ultrasound in August.  This revealed a 0.9 cm  mass in the medial right breast, as well as an indeterminate group of coarse heterogeneous calcifications measuring 0.6 cm in the upper outer right breast. 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  Estrogen and progesterone receptors were highly positive at 100%, and HER2/neu negative.  Ki 67 was 1%.  The biopsy of the area of calcifications revealed fibro-adenomatoid changes with calcifications.   She underwent testing for hereditary cancer syndromes with the [facility] Hereditary Cancer Panel test in August 2021.  This did not reveal any clinically significant mutation or variants of uncertain significance. She was treated with lumpectomy and sentinel lymph node biopsy in September. Surgical pathology from this procedure confirmed invasive ductal carcinoma, grade 1, and low grade ductal carcinoma in situ, measuring a total of 9 mm. Margins were negative for malignancy. 1 sentinel lymph node was negative for metastasis, so pathologic stage IA (pT1b pN0).  She received adjuvant radiation therapy to the right breast. Bone density scan in November was normal.  She was placed on anastrozole 1 mg daily in November 2021. Annual mammogram from June 2022 was clear. ? ?INTERVAL HISTORY:  ?Laura [name] is here for routine follow up and states that she has been fairly well. She does complain of pain of the left knee.  She did run out of potassium about 1 week ago.  She continues hydroxyurea 500 mg and and alternates 2 pills and 1 pill daily.  She also takes anastrozole daily without difficulty.  Her hemoglobin has increased from 11.8 to 12.5 with MCV of 109.  Her platelet count is 353,000.  White blood count is normal.  Nonfasting blood sugar is 98 but her potassium is down to 3.2.  Her BUN is 29 but the rest of her CMP is normal.  She is scheduled for colonoscopy with Dr. Lyda Jester next week.  She will continue to alternate from 500 mg daily to 1000 mg daily. Her  appetite is good, she has lost 7 pounds since her last visit.  She denies fever, chills or other signs of infection.  She denies nausea, vomiting, bowel issues, or abdominal pain.  She denies sore throat, cough, dyspnea, or chest pain. ? ?REVIEW OF SYSTEMS:  ?Review of Systems  ?Constitutional:  Positive for fatigue (in the evenings). Negative for appetite change, chills, fever and unexpected weight change.  ?HENT:  Negative.    ?Eyes: Negative.   ?Respiratory:  Negative.  Negative for chest tightness, cough, hemoptysis, shortness of breath and wheezing.   ?Cardiovascular: Negative.  Negative for chest pain, leg swelling and palpitations.  ?Gastrointestinal: Negative.  Negative for abdominal distention, abdominal pain, blood in stool, constipation, diarrhea, nausea and vomiting.  ?Endocrine: Negative.   ?Genitourinary: Negative.  Negative for difficulty urinating, dysuria, frequency and hematuria.   ?Musculoskeletal: Negative.  Negative for arthralgias, back pain, flank pain, gait problem and myalgias.  ?Skin: Negative.   ?Neurological: Negative.  Negative for dizziness, extremity weakness, gait problem, headaches, light-headedness, numbness, seizures and speech difficulty.  ?Hematological: Negative.   ?Psychiatric/Behavioral: Negative.  Negative for depression and sleep disturbance. The patient is not nervous/anxious.    ? ?VITALS:  ?Blood pressure 111/73, pulse 73, temperature 98.1 ?F (36.7 ?C), temperature source Oral, resp. rate 20, height '5\' 1"'  (1.549 m), weight 215 lb 11.2 oz (97.8 kg), SpO2 95 %.  ?Wt Readings from Last 3 Encounters:  ?12/26/21 215 lb 11.2 oz (97.8 kg)  ?08/08/21 222 lb 3.2 oz (100.8 kg)  ?  05/08/21 225 lb 14.4 oz (102.5 kg)  ?  ?Body mass index is 40.76 kg/m?. ? ?Performance status (ECOG): 0 - Asymptomatic ? ?PHYSICAL EXAM:  ?Physical Exam ?Constitutional:   ?   General: She is not in acute distress. ?   Appearance: Normal appearance. She is normal weight.  ?HENT:  ?   Head: Normocephalic and atraumatic.  ?Eyes:  ?   General: No scleral icterus. ?   Extraocular Movements: Extraocular movements intact.  ?   Conjunctiva/sclera: Conjunctivae normal.  ?   Pupils: Pupils are equal, round, and reactive to light.  ?Cardiovascular:  ?   Rate and Rhythm: Normal rate and regular rhythm.  ?   Pulses: Normal pulses.  ?   Heart sounds: Normal heart sounds. No murmur heard. ?  No friction rub. No gallop.  ?Pulmonary:  ?   Effort: Pulmonary effort is normal. No  respiratory distress.  ?   Breath sounds: Normal breath sounds.  ?Chest:  ?   Comments: She has a well healed scar in the upper inner quadrant of the right breast and right axilla. No masses in either breast. ?Abd

## 2021-12-26 ENCOUNTER — Inpatient Hospital Stay: Payer: Medicare HMO

## 2021-12-26 ENCOUNTER — Inpatient Hospital Stay: Payer: Medicare HMO | Attending: Oncology | Admitting: Oncology

## 2021-12-26 ENCOUNTER — Encounter: Payer: Self-pay | Admitting: Oncology

## 2021-12-26 ENCOUNTER — Other Ambulatory Visit: Payer: Self-pay | Admitting: Oncology

## 2021-12-26 VITALS — BP 111/73 | HR 73 | Temp 98.1°F | Resp 20 | Ht 61.0 in | Wt 215.7 lb

## 2021-12-26 DIAGNOSIS — D473 Essential (hemorrhagic) thrombocythemia: Secondary | ICD-10-CM

## 2021-12-26 DIAGNOSIS — Z170 Estrogen receptor positive status [ER+]: Secondary | ICD-10-CM

## 2021-12-26 DIAGNOSIS — C50211 Malignant neoplasm of upper-inner quadrant of right female breast: Secondary | ICD-10-CM

## 2021-12-26 LAB — BASIC METABOLIC PANEL
BUN: 29 — AB (ref 4–21)
CO2: 29 — AB (ref 13–22)
Chloride: 99 (ref 99–108)
Creatinine: 0.9 (ref 0.5–1.1)
Glucose: 98
Potassium: 3.2 mEq/L — AB (ref 3.5–5.1)
Sodium: 138 (ref 137–147)

## 2021-12-26 LAB — HEPATIC FUNCTION PANEL
ALT: 35 U/L (ref 7–35)
AST: 39 — AB (ref 13–35)
Alkaline Phosphatase: 98 (ref 25–125)
Bilirubin, Total: 0.8

## 2021-12-26 LAB — COMPREHENSIVE METABOLIC PANEL
Albumin: 4.2 (ref 3.5–5.0)
Calcium: 9.9 (ref 8.7–10.7)

## 2021-12-26 LAB — CBC: RBC: 3.31 — AB (ref 3.87–5.11)

## 2021-12-26 LAB — CBC AND DIFFERENTIAL
HCT: 36 (ref 36–46)
Hemoglobin: 12.5 (ref 12.0–16.0)
Neutrophils Absolute: 3.89
Platelets: 353 10*3/uL (ref 150–400)
WBC: 6.7

## 2021-12-31 ENCOUNTER — Telehealth: Payer: Self-pay | Admitting: Oncology

## 2021-12-31 ENCOUNTER — Telehealth: Payer: Self-pay | Admitting: Dietician

## 2021-12-31 NOTE — Telephone Encounter (Signed)
12/31/21 Scheduled next appt for Aug.Unable to leave msg. ?

## 2021-12-31 NOTE — Telephone Encounter (Signed)
Patient screened on MST. First attempt to reach. Unable to leave a voice mail to return call to set up a nutrition consult, mailbox is full. No mobile to send text, no email available. ? ?April Manson, RDN, LDN ?Registered Dietitian, [place] ?Part Time Remote (Usual office hours: Tuesday-Thursday) ?Cell: [phone]   ?

## 2022-01-02 DIAGNOSIS — D126 Benign neoplasm of colon, unspecified: Secondary | ICD-10-CM | POA: Insufficient documentation

## 2022-01-15 ENCOUNTER — Telehealth: Payer: Self-pay | Admitting: Dietician

## 2022-01-15 NOTE — Telephone Encounter (Signed)
Patient screened on MST.  Called and patient said she had no nutritional concerns at this time.  She reports she's been trying to eat healthier.  She also state she has activated her silver sneakers membership and is working out with spouse.  I offered to send Nutrition and Cancer Survival tips but she declined.  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 ? ?April Manson, RDN, LDN ?Registered Dietitian, [place] ?Part Time Remote (Usual office hours: Tuesday-Thursday) ?Cell: [phone]   ?

## 2022-01-30 ENCOUNTER — Other Ambulatory Visit: Payer: Self-pay | Admitting: Hematology and Oncology

## 2022-01-30 DIAGNOSIS — D473 Essential (hemorrhagic) thrombocythemia: Secondary | ICD-10-CM

## 2022-02-04 DIAGNOSIS — E876 Hypokalemia: Secondary | ICD-10-CM | POA: Insufficient documentation

## 2022-02-04 DIAGNOSIS — T502X5A Adverse effect of carbonic-anhydrase inhibitors, benzothiadiazides and other diuretics, initial encounter: Secondary | ICD-10-CM | POA: Insufficient documentation

## 2022-05-06 ENCOUNTER — Inpatient Hospital Stay: Payer: Medicare HMO | Attending: Oncology | Admitting: Oncology

## 2022-05-06 ENCOUNTER — Encounter: Payer: Self-pay | Admitting: Oncology

## 2022-05-06 ENCOUNTER — Other Ambulatory Visit: Payer: Self-pay | Admitting: Oncology

## 2022-05-06 ENCOUNTER — Inpatient Hospital Stay: Payer: Medicare HMO

## 2022-05-06 VITALS — BP 131/60 | HR 60 | Temp 98.4°F | Resp 18 | Ht 61.0 in | Wt 213.3 lb

## 2022-05-06 DIAGNOSIS — D126 Benign neoplasm of colon, unspecified: Secondary | ICD-10-CM | POA: Diagnosis not present

## 2022-05-06 DIAGNOSIS — D473 Essential (hemorrhagic) thrombocythemia: Secondary | ICD-10-CM

## 2022-05-06 DIAGNOSIS — C50211 Malignant neoplasm of upper-inner quadrant of right female breast: Secondary | ICD-10-CM | POA: Diagnosis not present

## 2022-05-06 DIAGNOSIS — T502X5A Adverse effect of carbonic-anhydrase inhibitors, benzothiadiazides and other diuretics, initial encounter: Secondary | ICD-10-CM

## 2022-05-06 DIAGNOSIS — E876 Hypokalemia: Secondary | ICD-10-CM

## 2022-05-06 DIAGNOSIS — Z170 Estrogen receptor positive status [ER+]: Secondary | ICD-10-CM

## 2022-05-06 LAB — HEPATIC FUNCTION PANEL
ALT: 30 U/L (ref 7–35)
AST: 32 (ref 13–35)
Alkaline Phosphatase: 88 (ref 25–125)
Bilirubin, Total: 0.8

## 2022-05-06 LAB — CBC: RBC: 3.19 — AB (ref 3.87–5.11)

## 2022-05-06 LAB — BASIC METABOLIC PANEL
BUN: 35 — AB (ref 4–21)
CO2: 29 — AB (ref 13–22)
Chloride: 98 — AB (ref 99–108)
Creatinine: 1 (ref 0.5–1.1)
Glucose: 109
Potassium: 3.4 mEq/L — AB (ref 3.5–5.1)
Sodium: 135 — AB (ref 137–147)

## 2022-05-06 LAB — CBC AND DIFFERENTIAL
HCT: 35 — AB (ref 36–46)
Hemoglobin: 12.2 (ref 12.0–16.0)
Neutrophils Absolute: 4.13
Platelets: 337 10*3/uL (ref 150–400)
WBC: 7

## 2022-05-06 LAB — COMPREHENSIVE METABOLIC PANEL
Albumin: 4.5 (ref 3.5–5.0)
Calcium: 9.8 (ref 8.7–10.7)

## 2022-05-06 NOTE — Progress Notes (Signed)
[facility]  [address] 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  She was occasionally treated with pulse busulfan, but then was lost to follow-up in 2011. She was referred back in March 2014, when her platelet count reached 1.4 million. She has been on hydroxyurea 1000 mg daily.  She was lost to follow-up again from May 2017 to July 2018.  She continued on hydroxyurea during that time.  She had worsening thrombocythemia, so she had increased the hydroxyurea 1000 mg daily alternating with 1500 mg daily.  In October 2018, the platelet count was down to 337,000, so we decreased the hydroxyurea to a 1000 mg daily.  Her platelet count was up to 533,000 in January 2019 at Dr. Unk Lightning office, so the hydroxyurea was increased to 1500 mg daily. Her platelets remained controlled on that dose. She has had mild renal insufficiency. She had hypokalemia, for which she was taking potassium chloride 20 mEq 3 times daily.  She has had chronic elevation of the liver transaminases since 2014, felt to be most likely due to medications and/or hepatic steatosis.  When she was seen for routine follow-up in October 2019 she had anemia and her platelet count had dropped to 154,000.  Hydroxyurea was placed on hold and we followed her CBC closely.  The anemia slowly resolved and platelet count slowly increased and was up to 568,000 in January 2020.  At that time, we resumed hydroxyurea 500 mg daily.  In February, her platelets remained elevated at 743,000, so  hydroxyurea was increased to 1000 mg.  She has done well since that time with her platelets running under 500,000.   Due to her family history of malignancy, we had previously discussed genetic testing, but she had declined, as her sister's testing was negative.  Due to her family history of colon cancer, she has been undergoing colonoscopy at least every 5 years. The patient's mother had endometrial cancer at age 61 and breast cancer at age 45, then developed non-Hodgkin's lymphoma at age 38. Her maternal grandmother had breast cancer at age 39. Her maternal grandfather had lung cancer at unknown age.  Her sister had a history of colon cancer at age 48 and then developed a primary breast cancer at age 61. Unfortunately, her sister died of metastatic breast cancer.  She states that her sister underwent genetic testing, which was apparently negative, but she is not sure what testing her sister had done.  Her brother had head and neck cancer at unknown age.  Her father had skin melanoma at age 55. A paternal aunt had breast cancer at age 78.   In May 2021, she was found to have a clinical stage IA (T1b N0 M0) hormone receptor positive breast cancer.  Annual screening bilateral mammogram in May 2021 revealed a possible mass and an area of calcifications of the right breast, and so had a right diagnostic mammogram and ultrasound in August.  This revealed a 0.9 cm mass in the medial right breast, as well as an indeterminate group of  coarse heterogeneous calcifications measuring 0.6 cm in the upper outer right breast. 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  Estrogen and progesterone receptors were highly positive at 100%, and HER2/neu negative.  Ki 67 was 1%.  The biopsy of the area of calcifications revealed fibro-adenomatoid changes with calcifications.  She underwent testing for hereditary cancer syndromes with the [facility] Hereditary Cancer Panel test in August 2021.  This did not reveal any clinically significant mutation or variants of uncertain significance. She was treated with lumpectomy and sentinel lymph node biopsy in September. Surgical pathology from this procedure confirmed invasive ductal carcinoma, grade 1, and low grade ductal carcinoma in situ, measuring a total of 9 mm. Margins were negative for malignancy. 1 sentinel lymph node was negative for metastasis, so pathologic stage IA (pT1b pN0).  She received adjuvant radiation therapy to the right breast. Bone density scan in November was normal.  She was placed on anastrozole 1 mg daily in November 2021. Annual mammogram from June 2023 was clear.  INTERVAL HISTORY:  Laura [name] is here for routine follow up and states that she has been fairly well. She continues hydroxyurea 500 mg and and alternates 2 pills and 1 pill daily.  She also takes anastrozole daily without difficulty.  Her hemoglobin is stable at 12.2 with MCV of 110.  Her platelet count is 337,000.  White blood count is normal.  Her potassium is better at 3.4.  Her BUN is 29 but the rest of her CMP is normal.  She had her colonoscopy with Dr. Lyda Jester on April 14, and this revealed 5 polyps which were tubular adenomas in the right colon and 1 tubular adenoma of the left colon, which were resected.  She was advised to have a repeat colonoscopy in 5 years.She had pneumonia in June.  She will continue to alternate hydroxyurea from 500 mg daily to 1000 mg daily. She is now on furosemide 40 mg also. Her  appetite is good, she has lost 2 pounds since her last visit.  She denies fever, chills or other signs of infection.  She denies nausea, vomiting, bowel issues, or abdominal pain.  She denies sore throat, cough, dyspnea, or chest pain.  REVIEW OF SYSTEMS:  Review of Systems  Constitutional:  Positive for fatigue (in the evenings). Negative for appetite change, chills, fever and unexpected weight change.   HENT:  Negative.    Eyes: Negative.   Respiratory: Negative.  Negative for chest tightness, cough, hemoptysis, shortness of breath and wheezing.   Cardiovascular: Negative.  Negative for chest pain, leg swelling and palpitations.  Gastrointestinal: Negative.  Negative for abdominal distention, abdominal pain, blood in stool, constipation, diarrhea, nausea and vomiting.  Endocrine: Negative.   Genitourinary: Negative.  Negative for difficulty urinating, dysuria, frequency and hematuria.   Musculoskeletal: Negative.  Negative for arthralgias, back pain, flank pain, gait problem and myalgias.  Skin: Negative.   Neurological: Negative.  Negative for dizziness, extremity weakness, gait problem, headaches, light-headedness, numbness, seizures and speech difficulty.  Hematological: Negative.   Psychiatric/Behavioral: Negative.  Negative for depression and sleep disturbance. The patient is not nervous/anxious.      VITALS:  Blood pressure 131/60, pulse 60, temperature 98.4 F (36.9 C), temperature source Oral, resp. rate 18, height _0  (1.549 m), weight 213 lb 4.8 oz (96.8 kg), SpO2 97 %.  Wt Readings from Last 3 Encounters:  05/06/22 213 lb 4.8 oz (96.8 kg)  12/26/21 215 lb 11.2 oz (97.8 kg)  08/08/21 222 lb 3.2 oz (100.8 kg)    Body mass index is 40.3 kg/m.  Performance status (ECOG): 0 - Asymptomatic  PHYSICAL EXAM:  Physical Exam Constitutional:      General: She is not in acute distress.    Appearance: Normal appearance. She is normal weight.  HENT:     Head: Normocephalic and atraumatic.  Eyes:     General: No scleral icterus.    Extraocular Movements: Extraocular movements intact.     Conjunctiva/sclera: Conjunctivae normal.     Pupils: Pupils are equal, round, and reactive to light.  Cardiovascular:     Rate and Rhythm: Normal rate and regular rhythm.     Pulses: Normal pulses.     Heart sounds: Normal heart sounds. No murmur heard.    No friction rub. No gallop.   Pulmonary:     Effort: Pulmonary effort is normal. No respiratory distress.     Breath sounds: Normal breath sounds.  Chest:     Comments: She has a well healed scar in the upper inner quadrant of the right breast and right axilla. No masses in either breast. Abdominal:     General: Bowel sounds are normal. There is no distension.     Palpations: Abdomen is soft. There is no hepatomegaly, splenomegaly or mass.     Tenderness: There is no abdominal tenderness.  Musculoskeletal:        General: Normal range of motion.     Cervical back: Normal range of motion and neck supple.     Right lower leg: No edema.     Left lower leg: No edema.  Lymphadenopathy:     Cervical: No cervical adenopathy.  Skin:    General: Skin is warm and dry.  Neurological:     General: No focal deficit present.     Mental Status: She is alert and oriented to person, place, and time. Mental status is at baseline.  Psychiatric:        Mood and Affect: Mood normal.        Behavior: Behavior normal.        Thought Content: Thought content normal.        Judgment: Judgment normal.    LABS:      Latest Ref Rng & Units 05/06/2022   12:00 AM 12/26/2021   12:00 AM 08/08/2021   12:00 AM  CBC  WBC  7.0     6.7     4.8   Hemoglobin 12.0 - 16.0 12.2     12.5     11.8   Hematocrit 36 - 46 35     36     34   Platelets 150 - 400 K/uL 337     353     198      This result is from an external source.      Latest Ref Rng & Units 05/06/2022   12:00 AM 12/26/2021   12:00 AM 08/08/2021   12:00 AM  CMP  BUN 4 - 21 35     29     21   Creatinine 0.5 - 1.1 1.0     0.9     0.9   Sodium 137 - 147 135     138     134   Potassium 3.5 - 5.1 mEq/L 3.4     3.2     3.6   Chloride 99 - 108 98  99     96   CO2 13 - _0 Calcium 8.7 - 10.7 9.8     9.9     9.5   Alkaline Phos 25 - 125 88     98     103   AST 13 - 35 32     39     36   ALT 7 - 35 U/L 30     35     30      This result is from an external  source.    STUDIES:   HISTORY:   Allergies:  Allergies  Allergen Reactions   Morphine Nausea And Vomiting and Nausea Only    Current Medications: Current Outpatient Medications  Medication Sig Dispense Refill   anastrozole (ARIMIDEX) 1 MG tablet Take 1 tablet by mouth once daily 90 tablet 3   aspirin 325 MG tablet Take 1 tablet by mouth daily.     benazepril-hydrochlorthiazide (LOTENSIN HCT) 20-25 MG tablet Take 1 tablet by mouth daily.     carvedilol (COREG) 12.5 MG tablet Take 12.5 mg by mouth 2 (two) times daily.     furosemide (LASIX) 40 MG tablet Take 40 mg by mouth daily.     hydroxyurea (HYDREA) 500 MG capsule Take 1 capsule by mouth twice daily 180 capsule 3   metFORMIN (GLUCOPHAGE) 1000 MG tablet Take 500 mg by mouth 2 (two) times daily. Per patient PCP lowered dose to 524m BID. 11/06/20     Multiple Vitamin (MULTIVITAMIN) tablet Take 1 tablet by mouth daily.     niacin 500 MG tablet Take 500 mg by mouth daily.     omega-3 fish oil (MAXEPA) 1000 MG CAPS capsule Take 2 tablets by mouth daily.     potassium chloride SA (KLOR-CON M20) 20 MEQ tablet Take by mouth.     simvastatin (ZOCOR) 40 MG tablet Take 40 mg by mouth at bedtime.     No current facility-administered medications for this visit.     ASSESSMENT & PLAN:   Assessment:  1.  Essential thrombocytosis, which remains well controlled with hydroxyurea 1000 mg alternating with 500 mg daily.  She will continue that dose.  2. Stage IA hormone right breast diagnosed in August 2021.  She had her annual mammogram in June and continues to follow with Dr. MLilia Pro She will continue with daily anastrozole.   3.  Hypokalemia.  She will continue her potassium supplement, 20 mEq daily.  4.  Tubular adenomas of both the right and left colon.    Plan:   She will continue alternating hydroxyurea 500 mg and 1000 mg daily. She knows to continue anastrozole daily. We will see her back in 6 months with CBC and CMP for repeat  evaluation. She verbalizes understanding of and agreement to the plans discussed today. She knows to call the office should any new questions or concerns arise.   I provided 20 minutes of face-to-face time during this this encounter and > 50% was spent counseling as documented under my assessment and plan.

## 2022-05-31 ENCOUNTER — Encounter: Payer: Self-pay | Admitting: Oncology

## 2022-08-29 ENCOUNTER — Other Ambulatory Visit: Payer: Self-pay | Admitting: Oncology

## 2022-11-06 ENCOUNTER — Other Ambulatory Visit: Payer: Self-pay | Admitting: Oncology

## 2022-11-06 ENCOUNTER — Inpatient Hospital Stay: Payer: Medicare HMO

## 2022-11-06 ENCOUNTER — Inpatient Hospital Stay: Payer: Medicare HMO | Attending: Oncology | Admitting: Oncology

## 2022-11-06 ENCOUNTER — Encounter: Payer: Self-pay | Admitting: Oncology

## 2022-11-06 VITALS — BP 126/58 | HR 77 | Temp 98.2°F | Resp 16 | Ht 61.0 in | Wt 207.0 lb

## 2022-11-06 DIAGNOSIS — C50211 Malignant neoplasm of upper-inner quadrant of right female breast: Secondary | ICD-10-CM

## 2022-11-06 DIAGNOSIS — Z170 Estrogen receptor positive status [ER+]: Secondary | ICD-10-CM

## 2022-11-06 DIAGNOSIS — D473 Essential (hemorrhagic) thrombocythemia: Secondary | ICD-10-CM | POA: Diagnosis not present

## 2022-11-06 LAB — CBC WITH DIFFERENTIAL (CANCER CENTER ONLY)
Abs Immature Granulocytes: 0.04 10*3/uL (ref 0.00–0.07)
Basophils Absolute: 0.1 10*3/uL (ref 0.0–0.1)
Basophils Relative: 1 %
Eosinophils Absolute: 0.2 10*3/uL (ref 0.0–0.5)
Eosinophils Relative: 3 %
HCT: 33.5 % — ABNORMAL LOW (ref 36.0–46.0)
Hemoglobin: 12.1 g/dL (ref 12.0–15.0)
Immature Granulocytes: 1 %
Lymphocytes Relative: 25 %
Lymphs Abs: 1.8 10*3/uL (ref 0.7–4.0)
MCH: 39.7 pg — ABNORMAL HIGH (ref 26.0–34.0)
MCHC: 36.1 g/dL — ABNORMAL HIGH (ref 30.0–36.0)
MCV: 109.8 fL — ABNORMAL HIGH (ref 80.0–100.0)
Monocytes Absolute: 0.9 10*3/uL (ref 0.1–1.0)
Monocytes Relative: 13 %
Neutro Abs: 4.2 10*3/uL (ref 1.7–7.7)
Neutrophils Relative %: 57 %
Platelet Count: 331 10*3/uL (ref 150–400)
RBC: 3.05 MIL/uL — ABNORMAL LOW (ref 3.87–5.11)
RDW: 14.7 % (ref 11.5–15.5)
WBC Count: 7.3 10*3/uL (ref 4.0–10.5)
nRBC: 0 % (ref 0.0–0.2)

## 2022-11-06 NOTE — Progress Notes (Addendum)
[facility]  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  She was occasionally treated with pulse busulfan, but then was lost to follow-up in 2011. She was referred back in March 2014, when her platelet count reached 1.4 million. She has been on hydroxyurea 1000 mg daily.  She was lost to follow-up again from May 2017 to July 2018.  She continued on hydroxyurea during that time.  She had worsening thrombocythemia, so she had increased the hydroxyurea 1000 mg daily alternating with 1500 mg daily.  In October 2018, the platelet count was down to 337,000, so we decreased the hydroxyurea to a 1000 mg daily.  Her platelet count was up to 533,000 in January 2019 at Dr. Unk Lightning office, so the hydroxyurea was increased to 1500 mg daily. Her platelets remained controlled on that dose. She has had mild renal insufficiency. She had hypokalemia, for which she was taking potassium chloride 20 mEq 3 times daily.  She has had chronic elevation of the liver transaminases since 2014, felt to be most likely due to medications and/or hepatic steatosis.  When she was seen for routine follow-up in October 2019 she had anemia and her platelet count had dropped to 154,000.  Hydroxyurea was placed on hold and we followed her CBC closely.  The anemia slowly resolved and platelet count slowly increased and was up to 568,000 in January 2020.  At that time, we resumed hydroxyurea 500 mg daily.  In February, her platelets remained elevated at 743,000, so  hydroxyurea was increased to 1000 mg.  She has done well since that time with her platelets running under 500,000.   Due to her family history of malignancy, we had previously discussed genetic testing, but she had declined, as her sister's testing was negative.  Due to her family history of colon cancer, she has been undergoing colonoscopy at least every 5 years. The patient's mother had endometrial cancer at age 60 and breast cancer at age 19, then developed non-Hodgkin's lymphoma at age 21. Her maternal grandmother had breast cancer at age 53. Her maternal grandfather had lung cancer at unknown age.  Her sister had a history of colon cancer at age 55 and then developed a primary breast cancer at age 71. Unfortunately, her sister died of metastatic breast cancer.  She states that her sister underwent genetic testing, which was apparently negative, but she is not sure what testing her sister had done.  Her brother had head and neck cancer at unknown age.  Her father had skin melanoma at age 57. A paternal aunt had breast cancer at age 46.   In May 2021, she was found to have a clinical stage IA (T1b N0 M0) hormone receptor positive breast cancer.  Annual screening bilateral mammogram in May 2021 revealed a possible mass and an area of calcifications of the right breast, and so had a right diagnostic mammogram and ultrasound in August.  This revealed a 0.9 cm mass in the medial right breast, as well as an indeterminate group of  coarse heterogeneous calcifications measuring 0.6 cm in the upper outer right breast. 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  Estrogen and progesterone receptors were highly positive at 100%, and HER2/neu negative.  Ki 67 was 1%.  The biopsy of the area of calcifications revealed fibro-adenomatoid changes with calcifications.  She underwent testing for hereditary cancer syndromes with the [facility] Hereditary Cancer Panel test in August 2021.  This did not reveal any clinically significant mutation or variants of uncertain significance. She was treated with lumpectomy and sentinel lymph node biopsy in September. Surgical pathology from this procedure confirmed invasive ductal carcinoma, grade 1, and low grade ductal carcinoma in situ, measuring a total of 9 mm. Margins were negative for malignancy. 1 sentinel lymph node was negative for metastasis, so pathologic stage IA (pT1b pN0).  She received adjuvant radiation therapy to the right breast. Bone density scan in November was normal.  She was placed on anastrozole 1 mg daily in November 2021. Annual mammogram from June 2023 was clear.  INTERVAL HISTORY:  Laura [name] is here for routine follow up for her history of essential thrombocytosis, as well as stage I A hormone receptor positive breast cancer. Patient sates that she is well and has no complaints. She does still have hot flashes but they are tolerable. She continues hydroxyurea 500 mg and and alternates 2 pills and 1 pill daily.  She also takes anastrozole daily without difficulty. Her annual mammogram is scheduled for June, 2024. Her labs today are pending. I will see her back in 6 months with CBC and CMP. She denies signs of infection such as sore throat, sinus drainage, cough, or urinary symptoms.  She denies fevers or recurrent chills. She denies pain. She denies nausea, vomiting, chest pain, dyspnea or cough. Her appetite is good and her weight has decreased 6 pounds over last 6 months .  REVIEW OF SYSTEMS:  Review of Systems  Constitutional: Negative.  Negative for appetite change, chills, diaphoresis, fatigue, fever and unexpected weight change.  HENT:  Negative.  Negative for hearing loss, lump/mass, mouth sores, nosebleeds, sore throat, tinnitus, trouble swallowing and voice change.   Eyes: Negative.  Negative for eye problems and icterus.  Respiratory: Negative.  Negative for chest  tightness, cough, hemoptysis, shortness of breath and wheezing.   Cardiovascular: Negative.  Negative for chest pain, leg swelling and palpitations.  Gastrointestinal: Negative.  Negative for abdominal distention, abdominal pain, blood in stool, constipation, diarrhea, nausea, rectal pain and vomiting.  Endocrine: Positive for hot flashes (tolerable).  Genitourinary: Negative.  Negative for bladder incontinence, difficulty urinating, dyspareunia, dysuria, frequency, hematuria, menstrual problem, nocturia, pelvic pain, vaginal bleeding and vaginal discharge.   Musculoskeletal: Negative.  Negative for arthralgias, back pain, flank pain, gait problem, myalgias, neck pain and neck stiffness.  Skin: Negative.  Negative for itching, rash and wound.  Neurological: Negative.  Negative for dizziness, extremity weakness, gait problem, headaches, light-headedness, numbness, seizures and speech difficulty.  Hematological: Negative.  Negative for adenopathy. Does not bruise/bleed easily.  Psychiatric/Behavioral: Negative.  Negative for confusion, decreased concentration, depression, sleep disturbance and suicidal ideas. The patient is not nervous/anxious.    VITALS:  Blood pressure (!) 126/58, pulse 77, temperature 98.2 F (36.8 C), temperature source Oral, resp. rate 16, height '5\' 1"'$  (1.549 m), weight 207 lb (93.9 kg), SpO2 98 %.  Wt Readings from Last 3 Encounters:  11/06/22 207 lb (93.9 kg)  05/06/22 213 lb  4.8 oz (96.8 kg)  12/26/21 215 lb 11.2 oz (97.8 kg)   Performance status (ECOG): 0 - Asymptomatic  PHYSICAL EXAM:  Physical Exam Vitals and nursing note reviewed.  Constitutional:      General: She is not in acute distress.    Appearance: Normal appearance. She is normal weight. She is not ill-appearing, toxic-appearing or diaphoretic.  HENT:     Head: Normocephalic and atraumatic.     Right Ear: Tympanic membrane, ear canal and external ear normal. There is no impacted cerumen.     Left Ear:  Tympanic membrane, ear canal and external ear normal. There is no impacted cerumen.     Nose: Nose normal. No congestion or rhinorrhea.     Mouth/Throat:     Mouth: Mucous membranes are moist.     Pharynx: Oropharynx is clear. No oropharyngeal exudate or posterior oropharyngeal erythema.  Eyes:     General: No scleral icterus.       Right eye: No discharge.        Left eye: No discharge.     Extraocular Movements: Extraocular movements intact.     Conjunctiva/sclera: Conjunctivae normal.     Pupils: Pupils are equal, round, and reactive to light.  Neck:     Vascular: No carotid bruit.  Cardiovascular:     Rate and Rhythm: Normal rate and regular rhythm.     Pulses: Normal pulses.     Heart sounds: Normal heart sounds. No murmur heard.    No friction rub. No gallop.  Pulmonary:     Effort: Pulmonary effort is normal. No respiratory distress.     Breath sounds: Normal breath sounds. No stridor. No wheezing, rhonchi or rales.  Chest:     Chest wall: No tenderness.  Breasts:    Right: Normal.     Left: Normal.     Comments: Breasts are without masses Abdominal:     General: Bowel sounds are normal. There is no distension.     Palpations: Abdomen is soft. There is no hepatomegaly, splenomegaly or mass.     Tenderness: There is no abdominal tenderness. There is no right CVA tenderness, left CVA tenderness, guarding or rebound.     Hernia: No hernia is present.  Musculoskeletal:        General: No swelling, tenderness, deformity or signs of injury. Normal range of motion.     Cervical back: Normal range of motion and neck supple. No rigidity or tenderness.     Right lower leg: No edema.     Left lower leg: No edema.  Lymphadenopathy:     Cervical: No cervical adenopathy.  Skin:    General: Skin is warm and dry.     Coloration: Skin is not jaundiced or pale.     Findings: No bruising, erythema, lesion or rash.  Neurological:     General: No focal deficit present.     Mental  Status: She is alert and oriented to person, place, and time. Mental status is at baseline.     Cranial Nerves: No cranial nerve deficit.     Sensory: No sensory deficit.     Motor: No weakness.     Coordination: Coordination normal.     Gait: Gait normal.     Deep Tendon Reflexes: Reflexes normal.  Psychiatric:        Mood and Affect: Mood normal.        Behavior: Behavior normal.        Thought Content: Thought content  normal.        Judgment: Judgment normal.   LABS:      Latest Ref Rng & Units 11/06/2022    3:43 PM 05/06/2022   12:00 AM 12/26/2021   12:00 AM  CBC  WBC 4.0 - 10.5 K/uL 7.3  7.0     6.7      Hemoglobin 12.0 - 15.0 g/dL 12.1  12.2     12.5      Hematocrit 36.0 - 46.0 % 33.5  35     36      Platelets 150 - 400 K/uL 331  337     353         This result is from an external source.      Latest Ref Rng & Units 05/06/2022   12:00 AM 12/26/2021   12:00 AM 08/08/2021   12:00 AM  CMP  BUN 4 - 21 35     29     21   Creatinine 0.5 - 1.1 1.0     0.9     0.9   Sodium 137 - 147 135     138     134   Potassium 3.5 - 5.1 mEq/L 3.4     3.2     3.6   Chloride 99 - 108 98     99     96   CO2 13 - '22 29     29     28   '$ Calcium 8.7 - 10.7 9.8     9.9     9.5   Alkaline Phos 25 - 125 88     98     103   AST 13 - 35 32     39     36   ALT 7 - 35 U/L 30     35     30      This result is from an external source.     Ref Range & Units   10/30/2022  Sodium 135 - 146 MMOL/L 138  Potassium 3.5 - 5.3 MMOL/L 4.3  Comment: NO VISIBLE HEMOLYSIS  Chloride 98 - 110 MMOL/L 98  CO2 21 - 31 MMOL/L 31  BUN 8 - 24 MG/DL 29 High   Glucose 70 - 99 MG/DL 162 High   Creatinine 0.60 - 1.20 MG/DL 1.21 High   Calcium 8.5 - 10.5 MG/DL 10.1  Total Protein 6.4 - 8.9 G/DL 6.8  Albumin 3.5 - 5.7 G/DL 4.6  Total Bilirubin 0.0 - 1.0 MG/DL 0.7  Alkaline Phosphatase 34 - 104 IU/L or U/L 80  AST (SGOT) 13 - 39 IU/L or U/L 15  ALT (SGPT) 7 - 52 IU/L or U/L 18  Anion Gap 4 - 14 MMOL/L 9  Est. GFR  >=60 ML/MIN/1.73 M*2 48 Low     Ref Range & Units 10/30/2022  HEMOGLOBIN A1C <5.8 % 6.8 High   eAG MG/DL 148     Ref Range & Units    10/30/2022  LDL Direct <100 mg/dL 60  Total Cholesterol <200 MG/DL 124  Triglycerides <150 MG/DL 152 High   HDL Cholesterol >=60 MG/DL 40 Low   Total Chol / HDL Cholesterol <4.5 3.1  Non-HDL Cholesterol MG/DL 84   STUDIES:   HISTORY:   Allergies:  Allergies  Allergen Reactions   Morphine Nausea And Vomiting and Nausea Only    Current Medications: Current Outpatient Medications  Medication Sig Dispense Refill   anastrozole (ARIMIDEX) 1 MG tablet  Take 1 tablet by mouth once daily 90 tablet 30   aspirin 325 MG tablet Take 1 tablet by mouth daily.     benazepril-hydrochlorthiazide (LOTENSIN HCT) 20-25 MG tablet Take 1 tablet by mouth daily.     carvedilol (COREG) 12.5 MG tablet Take 12.5 mg by mouth 2 (two) times daily.     furosemide (LASIX) 40 MG tablet Take 40 mg by mouth daily.     hydroxyurea (HYDREA) 500 MG capsule Take 1 capsule by mouth twice daily 180 capsule 3   metFORMIN (GLUCOPHAGE) 1000 MG tablet Take 500 mg by mouth 2 (two) times daily. Per patient PCP lowered dose to '500mg'$  BID. 11/06/20     Multiple Vitamin (MULTIVITAMIN) tablet Take 1 tablet by mouth daily.     niacin 500 MG tablet Take 500 mg by mouth daily.     omega-3 fish oil (MAXEPA) 1000 MG CAPS capsule Take 2 tablets by mouth daily.     potassium chloride SA (KLOR-CON M20) 20 MEQ tablet Take by mouth.     simvastatin (ZOCOR) 40 MG tablet Take 40 mg by mouth at bedtime.     No current facility-administered medications for this visit.     ASSESSMENT & PLAN:   Assessment:  1.  Essential thrombocytosis, which remains well controlled with hydroxyurea 1000 mg alternating with 500 mg daily.  She will continue that dose.  2. Stage IA hormone right breast diagnosed in August 2021.  She has her annual mammogram in June and continues to follow with Dr. Lilia Pro. She will continue  with daily anastrozole.   3.  Hypokalemia.  She will continue her potassium supplement, 20 mEq daily.  4.  Tubular adenomas of both the right and left colon.  Plan:   She continues hydroxyurea 500 mg and and alternates 2 pills and 1 pill daily.  She also takes anastrozole daily without difficulty. Her annual mammogram is scheduled for June, 2024. Her labs today are pending. I will see her back in 6 months with CBC and CMP. She verbalizes understanding of and agreement to the plans discussed today. She knows to call the office should any new questions or concerns arise.   I provided 12 minutes of face-to-face time during this this encounter and > 50% was spent counseling as documented under my assessment and plan.     I,Jasmine M Lassiter,acting as a scribe for Derwood Kaplan, MD.,have documented all relevant documentation on the behalf of Derwood Kaplan, MD,as directed by  Derwood Kaplan, MD while in the presence of Derwood Kaplan, MD.

## 2022-11-09 ENCOUNTER — Telehealth: Payer: Self-pay | Admitting: Oncology

## 2022-11-09 NOTE — Telephone Encounter (Signed)
11/09/22 Spoke with patient and scheduled 55mh f/u appt

## 2022-11-10 ENCOUNTER — Telehealth: Payer: Self-pay

## 2022-11-10 NOTE — Telephone Encounter (Addendum)
Pt notified of below. She mentioned that she saw a few results that were a  little low. "I didn't mention it to Dr Hinton Rao when I saw her, because I really didn't want to make a deal of it. I am really tired in the evenings. Like I have to take a little nap before I can even watch TV. Could any of those numbers be the reason or is it just because I'm 72 years old"?  ----- Message from Derwood Kaplan, MD sent at 11/10/2022  1:55 PM EST ----- Regarding: call Tell her blood looks good, incl platelets of 331,000

## 2023-03-10 LAB — HM MAMMOGRAPHY

## 2023-03-25 ENCOUNTER — Other Ambulatory Visit: Payer: Self-pay | Admitting: Oncology

## 2023-03-25 DIAGNOSIS — D473 Essential (hemorrhagic) thrombocythemia: Secondary | ICD-10-CM

## 2023-05-07 ENCOUNTER — Inpatient Hospital Stay: Payer: Medicare Other

## 2023-05-07 ENCOUNTER — Inpatient Hospital Stay: Payer: Medicare Other | Admitting: Oncology

## 2023-05-20 ENCOUNTER — Inpatient Hospital Stay: Payer: Medicare Other | Admitting: Hematology and Oncology

## 2023-05-20 ENCOUNTER — Encounter: Payer: Self-pay | Admitting: Hematology and Oncology

## 2023-05-20 ENCOUNTER — Inpatient Hospital Stay: Payer: Medicare Other | Attending: Hematology and Oncology

## 2023-05-20 VITALS — BP 133/59 | HR 71 | Temp 98.1°F | Resp 20 | Ht 61.0 in | Wt 211.3 lb

## 2023-05-20 DIAGNOSIS — Z13820 Encounter for screening for osteoporosis: Secondary | ICD-10-CM | POA: Diagnosis not present

## 2023-05-20 DIAGNOSIS — Z170 Estrogen receptor positive status [ER+]: Secondary | ICD-10-CM

## 2023-05-20 DIAGNOSIS — D473 Essential (hemorrhagic) thrombocythemia: Secondary | ICD-10-CM | POA: Diagnosis present

## 2023-05-20 LAB — CMP (CANCER CENTER ONLY)
ALT: 25 U/L (ref 0–44)
AST: 21 U/L (ref 15–41)
Albumin: 4.1 g/dL (ref 3.5–5.0)
Alkaline Phosphatase: 74 U/L (ref 38–126)
Anion gap: 13 (ref 5–15)
BUN: 41 mg/dL — ABNORMAL HIGH (ref 8–23)
CO2: 27 mmol/L (ref 22–32)
Calcium: 9.7 mg/dL (ref 8.9–10.3)
Chloride: 97 mmol/L — ABNORMAL LOW (ref 98–111)
Creatinine: 1.26 mg/dL — ABNORMAL HIGH (ref 0.44–1.00)
GFR, Estimated: 46 mL/min — ABNORMAL LOW (ref >60)
Glucose, Bld: 117 mg/dL — ABNORMAL HIGH (ref 70–99)
Potassium: 3.4 mmol/L — ABNORMAL LOW (ref 3.5–5.1)
Sodium: 137 mmol/L (ref 135–145)
Total Bilirubin: 0.7 mg/dL (ref 0.3–1.2)
Total Protein: 7 g/dL (ref 6.5–8.1)

## 2023-05-20 LAB — CBC AND DIFFERENTIAL
HCT: 34 — AB (ref 36–46)
Hemoglobin: 12.2 (ref 12.0–16.0)
MCV: 109 — AB (ref 81–99)
Neutrophils Absolute: 3.8
Platelets: 335 10*3/uL (ref 150–400)
WBC: 7.6

## 2023-05-20 LAB — CBC W DIFFERENTIAL (~~LOC~~ CC SCANNED REPORT)

## 2023-05-20 LAB — CBC: RBC: 3.14 — AB (ref 3.87–5.11)

## 2023-05-20 NOTE — Progress Notes (Signed)
[facility]  [address] [place],  [place]  [address] [phone]  Clinic Day:  05/20/2023  Referring physician: Hadley Pen, MD   CHIEF COMPLAINT:  CC: Essential thrombocythemia  Current Treatment: Hydroxyurea 500 mg daily alternating with 1000 mg daily  HISTORY OF PRESENT ILLNESS:  Laura [name] is a 72 y.o. 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  She was occasionally treated with post busulfan, but then lost to follow-up in 2011.  She was referred back in March 2014 when her platelet count reached 1.4 million.  She was treated with hydroxyurea 1000 mg daily she was lost to follow-up again from May 2017 to July 2018.  She continued hydroxyurea during that time. She had worsening thrombocythemia, so she had increased the hydroxyurea 1000 mg daily alternating with 1500 mg daily.  In October 2018, the platelet count was down to 337,000, so we decreased the hydroxyurea to a 1000 mg daily.  Her platelet count was up to 533,000 in January 2019 at Dr. Sherral [name] office, so the hydroxyurea was increased to 1500 mg daily. When she was seen for routine follow-up in October 2019 she had anemia and her platelet count had dropped to 154,000.  Hydroxyurea was placed on hold and we followed her CBC closely.  The anemia slowly resolved and platelet count slowly increased and was up to 568,000 in January 2020.  At that time, we resumed hydroxyurea 500 mg daily.  In February 2020, her platelets remained elevated at 743,000, so hydroxyurea was increased to 1000 mg, with good control of her thrombocythemia.  In November 2022, her platelet count was under 200, so hydroxyurea was decreased to 500 mg daily alternating with the 1000 mg daily, again with good control of her thrombocythemia.  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  Prior to this, due  to her family history, genetic testing was suggested, but declined as her sisters genetic testing was negative.  Annual lateral screening mammogram in May 2021 revealed a possible mass, as well as an area of calcifications of the right breast. Right diagnostic mammogram and ultrasound in August revealed a 0.9 cm mass in the medial right breast, as well as an indeterminate group of coarse heterogeneous calcifications measuring 0.6 cm in the upper outer right breast. 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  Estrogen and progesterone receptors were highly positive at 100%, and HER2/neu negative.  Ki 67 was 1%.  The biopsy of the area of calcifications revealed fibro-adenomatoid changes with calcifications.  She underwent testing for hereditary cancer syndromes with the [facility] Hereditary Cancer Panel test in August 2021.  This did not reveal any clinically significant mutation or variants of uncertain significance. She was treated with lumpectomy and sentinel lymph node biopsy in September. Pathology confirmed a 9 mm, grade 1, invasive ductal carcinoma, as well as low grade ductal carcinoma in situ,.  Margins were negative for malignancy. 1 sentinel lymph node was negative for metastasis.  She received adjuvant radiation therapy to the right breast. Bone density scan in November 2021 was normal.  She was placed on anastrozole 1 mg daily in November 2021.  Annual bilateral diagnostic mammograms have not revealed any evidence of malignancy.  Colonoscopy in April 2023 revealed small tubular adenomas.  In June 2023, bilateral diagnostic  mammogram was once again without evidence of malignancy.  She has mild renal insufficiency. She has had hypokalemia due to furosemide, for which she is taking potassium chloride supplement.  She has had intermittent mild elevation of the liver transaminases since 2014, felt to be most likely due to  medications and/or hepatic steatosis.    Oncology History  Malignant neoplasm of upper-inner quadrant of right female breast (HCC)  04/24/2020 Initial Diagnosis   Breast cancer, right (HCC)   05/07/2020 Cancer Staging   Staging form: Breast, AJCC 8th Edition - Clinical stage from 05/07/2020: Stage IA (cT1b, cN0(sn), cM0, G1, ER+, PR+, HER2-) - Signed by Laura Beckwith, MD on 08/07/2020       INTERVAL HISTORY:  Laura [name] is here today for repeat clinical assessment.  She states that she continues hydroxyurea 500 mg alternating with 1000 mg daily without difficulty.  She denies any changes in her breasts.  She denies fevers or chills. She reports chronic left knee pain which has improved. Her appetite is good. Her weight has increased 4 pounds over last 6 months .  She continues to follow with Dr. Lequita [name].  Bilateral screening mammogram in June did not reveal any evidence of malignancy  REVIEW OF SYSTEMS:  Review of Systems  Constitutional:  Negative for appetite change, chills, fatigue, fever and unexpected weight change.  HENT:   Negative for lump/mass, mouth sores and sore throat.   Respiratory:  Negative for cough and shortness of breath.   Cardiovascular:  Negative for chest pain and leg swelling.  Gastrointestinal:  Negative for abdominal pain, constipation, diarrhea, nausea and vomiting.  Endocrine: Negative for hot flashes.  Genitourinary:  Negative for difficulty urinating, dysuria, frequency and hematuria.   Musculoskeletal:  Negative for arthralgias, back pain and myalgias.  Skin:  Negative for rash.  Neurological:  Negative for dizziness and headaches.  Hematological:  Negative for adenopathy. Does not bruise/bleed easily.  Psychiatric/Behavioral:  Negative for depression and sleep disturbance. The patient is not nervous/anxious.      VITALS:  Blood pressure (!) 133/59, pulse 71, temperature 98.1 F (36.7 C), temperature source Oral, resp. rate 20, height 5\' 1"  (1.549 m),  weight 211 lb 4.8 oz (95.8 kg), SpO2 98%.  Wt Readings from Last 3 Encounters:  05/20/23 211 lb 4.8 oz (95.8 kg)  11/06/22 207 lb (93.9 kg)  05/06/22 213 lb 4.8 oz (96.8 kg)    Body mass index is 39.92 kg/m.  Performance status (ECOG): 1 - Symptomatic but completely ambulatory  PHYSICAL EXAM:  Physical Exam Vitals and nursing note reviewed.  Constitutional:      General: She is not in acute distress.    Appearance: Normal appearance.  HENT:     Head: Normocephalic and atraumatic.     Mouth/Throat:     Mouth: Mucous membranes are moist.     Pharynx: Oropharynx is clear. No oropharyngeal exudate or posterior oropharyngeal erythema.  Eyes:     General: No scleral icterus.    Extraocular Movements: Extraocular movements intact.     Conjunctiva/sclera: Conjunctivae normal.     Pupils: Pupils are equal, round, and reactive to light.  Cardiovascular:     Rate and Rhythm: Normal rate and regular rhythm.     Heart sounds: Normal heart sounds. No murmur heard.    No friction rub. No gallop.  Pulmonary:     Effort: Pulmonary effort is normal.     Breath sounds: Normal breath sounds. No wheezing, rhonchi or rales.  Chest:  Breasts:  Right: Normal. No swelling, bleeding, inverted nipple, mass, nipple discharge, skin change or tenderness.     Left: Normal. No swelling, bleeding, inverted nipple, mass, nipple discharge, skin change or tenderness.     Comments: Stable postsurgical changes in the right breast Abdominal:     General: There is no distension.     Palpations: Abdomen is soft. There is no hepatomegaly, splenomegaly or mass.     Tenderness: There is no abdominal tenderness.  Musculoskeletal:        General: Normal range of motion.     Cervical back: Normal range of motion and neck supple. No tenderness.     Right lower leg: No edema.     Left lower leg: No edema.  Lymphadenopathy:     Cervical: No cervical adenopathy.     Upper Body:     Right upper body: No  supraclavicular or axillary adenopathy.     Left upper body: No supraclavicular or axillary adenopathy.     Lower Body: No right inguinal adenopathy. No left inguinal adenopathy.  Skin:    General: Skin is warm and dry.     Coloration: Skin is not jaundiced.     Findings: No rash.  Neurological:     Mental Status: She is alert and oriented to person, place, and time.     Cranial Nerves: No cranial nerve deficit.  Psychiatric:        Mood and Affect: Mood normal.        Behavior: Behavior normal.        Thought Content: Thought content normal.     LABS:      Latest Ref Rng & Units 05/20/2023   12:00 AM 11/06/2022    3:43 PM 05/06/2022   12:00 AM  CBC  WBC  7.6     7.3  7.0      Hemoglobin 12.0 - 16.0 12.2     12.1  12.2      Hematocrit 36 - 46 34     33.5  35      Platelets 150 - 400 K/uL 335     331  337         This result is from an external source.      Latest Ref Rng & Units 05/20/2023    3:40 PM 05/06/2022   12:00 AM 12/26/2021   12:00 AM  CMP  Glucose 70 - 99 mg/dL 629     BUN 8 - 23 mg/dL 41  35     29      Creatinine 0.44 - 1.00 mg/dL 5.28  1.0     0.9      Sodium 135 - 145 mmol/L 137  135     138      Potassium 3.5 - 5.1 mmol/L 3.4  3.4     3.2      Chloride 98 - 111 mmol/L 97  98     99      CO2 22 - 32 mmol/L 27  29     29       Calcium 8.9 - 10.3 mg/dL 9.7  9.8     9.9      Total Protein 6.5 - 8.1 g/dL 7.0     Total Bilirubin 0.3 - 1.2 mg/dL 0.7     Alkaline Phos 38 - 126 U/L 74  88     98      AST 15 - 41 U/L 21  32  39      ALT 0 - 44 U/L 25  30     35         This result is from an external source.     No results found for: "CEA1", "CEA" / No results found for: "CEA1", "CEA" No results found for: "PSA1" No results found for: "XBJ478" No results found for: "CAN125"  No results found for: "TOTALPROTELP", "ALBUMINELP", "A1GS", "A2GS", "BETS", "BETA2SER", "GAMS", "MSPIKE", "SPEI" No results found for: "TIBC", "FERRITIN", "IRONPCTSAT" No results  found for: "LDH"  STUDIES:    Exam(s): 2956-2130 MAM/MAM DIGITAL W/TOMO DIAG B  CLINICAL DATA: 72 year old female presents for annual follow-up.  History of RIGHT breast cancer and lumpectomy in 2021.  EXAM:  DIGITAL DIAGNOSTIC BILATERAL MAMMOGRAM WITH TOMOSYNTHESIS  TECHNIQUE:  Bilateral digital diagnostic mammography and breast tomosynthesis  was performed.  COMPARISON: Previous exam(s).  ACR Breast Density Category b: There are scattered areas of  fibroglandular density.  FINDINGS:  2D and 3D full field views of both breasts and a magnification view  of the lumpectomy site demonstrate no suspicious mass, nonsurgical  distortion or worrisome calcifications.  RIGHT lumpectomy changes are again noted.  IMPRESSION:  No evidence of breast malignancy.  RECOMMENDATION:  Per protocol, as the patient is now 2 or more years status post  lumpectomy, she may return to annual screening mammography in 1  year. However, given the history of breast cancer, the patient  remains eligible for annual diagnostic mammography if preferred.    HISTORY:   Past Medical History:  Diagnosis Date   Anemia due to antineoplastic chemotherapy 08/08/2021   Breast cancer (HCC) 05/02/2020   Hypertension    Osteoarthritis     Past Surgical History:  Procedure Laterality Date   APPENDECTOMY     BREAST LUMPECTOMY Right 06/10/2020   REPLACEMENT TOTAL KNEE Right    TUBAL LIGATION Bilateral    UMBILICAL HERNIA REPAIR      Family History  Problem Relation Age of Onset   Breast cancer Mother    Skin cancer Mother    Uterine cancer Mother    Lymphoma Mother     Social History:  reports that she has never smoked. She has never used smokeless tobacco. She reports that she does not drink alcohol and does not use drugs.The patient is alone today.  Allergies:  Allergies  Allergen Reactions   Morphine Nausea And Vomiting, Nausea Only and Other (See Comments)    Current Medications: Current  Outpatient Medications  Medication Sig Dispense Refill   cetirizine (ZYRTEC) 10 MG tablet 10 mg daily.     glucose blood (PRECISION QID TEST) test strip 1 strip by miscellaneous route 2 (two) times a day.     anastrozole (ARIMIDEX) 1 MG tablet Take 1 tablet by mouth once daily 90 tablet 30   aspirin 325 MG tablet Take 325 mg by mouth daily. 2 tabs daily per patient     benazepril-hydrochlorthiazide (LOTENSIN HCT) 20-25 MG tablet Take 1 tablet by mouth daily.     carvedilol (COREG) 12.5 MG tablet Take 12.5 mg by mouth 2 (two) times daily.     furosemide (LASIX) 40 MG tablet Take 40 mg by mouth daily.     hydroxyurea (HYDREA) 500 MG capsule Take 1 capsule by mouth twice daily (Patient taking differently: Take 500 mg by mouth 2 (two) times daily. Patient reports taking 1 tab every other day and 2 tablets on in between days) 180 capsule 1   metFORMIN (GLUCOPHAGE)  1000 MG tablet Take 500 mg by mouth 2 (two) times daily. Per patient PCP lowered dose to 500mg  BID. 11/06/20     Multiple Vitamin (MULTIVITAMIN) tablet Take 1 tablet by mouth daily. (Patient not taking: Reported on 05/20/2023)     omega-3 fish oil (MAXEPA) 1000 MG CAPS capsule Take 2 tablets by mouth daily.     potassium chloride SA (KLOR-CON M20) 20 MEQ tablet Take 20 mEq by mouth daily. Patients reports taking 4 tabs daily     simvastatin (ZOCOR) 40 MG tablet Take 40 mg by mouth at bedtime.     No current facility-administered medications for this visit.     ASSESSMENT & PLAN:   Assessment & Plan:   1.  Essential thrombocythemia.  Her blood counts remain normal.  She will continue hydroxyurea 500 mg daily alternating with the 1000 mg daily.  2.  Stage IA hormone right breast diagnosed in August 2021.  She remains without evidence of recurrence.  She continues to follow with Dr. Lequita [name].  Mammogram from June did not evidence of malignancy.  She knows to continue anastrozole daily for at least a total of 5 years.  3.  Borderline  hypokalemia despite potassium chloride 20 mEq 2 tablets twice daily.  We will fax her labs to Dr. Sherral [name].  4.  Worsening renal function.  We asked her to see Dr. Sherral [name] regarding this.  5.  Tubular adenomas.  She will follow-up with Dr. Jennye Boroughs as recommended.   The patient understands the plans discussed today and is in agreement with them.  She knows to contact our office if she develops concerns prior to her next appointment.     I provided 30 minutes of face-to-face time during this encounter and > 50% was spent counseling as documented under my assessment and plan.    Adah Perl, PA-C  [facility] [address] [place] [address] Dept: [phone] Dept Fax: [phone]   Orders Placed This Encounter  Procedures   DG Bone Density    Standing Status:   Future    Standing Expiration Date:   05/19/2024    Scheduling Instructions:     RH    Order Specific Question:   Reason for Exam (SYMPTOM  OR DIAGNOSIS REQUIRED)    Answer:   screening for osteoporosis, on an aromatase inhibitor    Order Specific Question:   Preferred imaging location?    Answer:   External   CBC and differential    This external order was created through the Results Console.   CBC    This external order was created through the Results Console.

## 2023-05-21 ENCOUNTER — Telehealth: Payer: Self-pay

## 2023-05-21 ENCOUNTER — Encounter: Payer: Self-pay | Admitting: Hematology and Oncology

## 2023-05-21 NOTE — Telephone Encounter (Signed)
-----   Message from Adah Perl sent at 05/21/2023  8:19 AM EDT ----- Please let her know her kidney function is not as good as previous, so she should push clear fluids. F/U with Dr. [name] soon to address. Please fax labs to Dr. [name]. Thanks

## 2023-05-21 NOTE — Telephone Encounter (Signed)
Labs Forwarded to Dr. [name]. Patient advised about her kidney function and to increase her fluids.

## 2023-06-07 ENCOUNTER — Telehealth: Payer: Self-pay

## 2023-06-07 NOTE — Telephone Encounter (Signed)
Attempted to contact patient. No answer. 

## 2023-06-07 NOTE — Telephone Encounter (Signed)
-----   Message from Dellia Beckwith sent at 06/03/2023  7:45 PM EDT ----- Regarding: call I don't know if we ever let her know about rest of blood, I think Kelli saw her.  She is dehydrated and K is mildly low - I rec she increase fluids and K in her diet.  The rest is normal

## 2023-06-17 LAB — HM DEXA SCAN

## 2023-06-25 ENCOUNTER — Telehealth: Payer: Self-pay | Admitting: Hematology and Oncology

## 2023-06-25 ENCOUNTER — Telehealth: Payer: Self-pay

## 2023-06-25 NOTE — Telephone Encounter (Signed)
-----   Message from Adah Perl sent at 06/24/2023  5:09 PM EDT ----- Regarding: RE: appointment How long has this been going on for? Has she been sick? Any fever, sore throat, cough, etc? If not, I am happy to see her, but would have to be tomorrow morning or next week, right? Thanks ----- Message ----- From: Dyane Dustman, RN Sent: 06/24/2023   4:40 PM EDT To: Adah Perl, PA-C Subject: appointment                                    She would like to be seen sooner then February.  She has had an increase in night sweats "sticky and wet".  Severe itching especially on the head.  States mom had breast cancer, then ended up with lymphoma.  She did have her bone scan recently.    Recommendation:

## 2023-06-25 NOTE — Telephone Encounter (Signed)
Sent to scheduling for appt next week with Belva Crome PA.  Patient had denied fever,chills, fever or sore throat when I spoke with her.

## 2023-06-25 NOTE — Telephone Encounter (Signed)
Patient has been scheduled. Aware of appt date and time.    FW: appointment Received: Today Dyane Dustman, RN  [facility] Scheduling Please schedule with Belva Crome PA next week.       Previous Messages    ----- Message ----- From: Adah Perl, PA-C Sent: 06/24/2023   5:12 PM EDT To: Dyane Dustman, RN Subject: RE: appointment                                How long has this been going on for? Has she been sick? Any fever, sore throat, cough, etc? If not, I am happy to see her, but would have to be tomorrow morning or next week, right? Thanks ----- Message ----- From: Dyane Dustman, RN Sent: 06/24/2023   4:40 PM EDT To: Adah Perl, PA-C Subject: appointment                                    She would like to be seen sooner then February.  She has had an increase in night sweats "sticky and wet".  Severe itching especially on the head.  States mom had breast cancer, then ended up with lymphoma.  She did have her bone scan recently.    Recommendation:

## 2023-06-29 ENCOUNTER — Other Ambulatory Visit: Payer: Self-pay | Admitting: Hematology and Oncology

## 2023-06-29 DIAGNOSIS — D473 Essential (hemorrhagic) thrombocythemia: Secondary | ICD-10-CM

## 2023-06-29 NOTE — Progress Notes (Signed)
[facility]  [address] [place],  [place]  [address] [phone]  Clinic Day: 06/30/2023  Referring physician: Hadley Pen, MD   CHIEF COMPLAINT:  CC: Essential thrombocythemia  Current Treatment: Hydroxyurea 500 mg daily alternating with 1000 mg daily  HISTORY OF PRESENT ILLNESS:  Laura [name] is a 72 y.o. 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  She was occasionally treated with post busulfan, but then lost to follow-up in 2011.  She was referred back in March 2014 when her platelet count reached 1.4 million.  She was treated with hydroxyurea 1000 mg daily she was lost to follow-up again from May 2017 to July 2018.  She continued hydroxyurea during that time. She had worsening thrombocythemia, so she had increased the hydroxyurea 1000 mg daily alternating with 1500 mg daily.  In October 2018, the platelet count was down to 337,000, so we decreased the hydroxyurea to a 1000 mg daily.  Her platelet count was up to 533,000 in January 2019 at Dr. [name] office, so the hydroxyurea was increased to 1500 mg daily. When she was seen for routine follow-up in October 2019 she had anemia and her platelet count had dropped to 154,000.  Hydroxyurea was placed on hold and we followed her CBC closely.  The anemia slowly resolved and platelet count slowly increased and was up to 568,000 in January 2020.  At that time, we resumed hydroxyurea 500 mg daily.  In February 2020, her platelets remained elevated at 743,000, so hydroxyurea was increased to 1000 mg, with good control of her thrombocythemia.  In November 2022, her platelet count was under 200, so hydroxyurea was decreased to 500 mg daily alternating with the 1000 mg daily, again with good control of her thrombocythemia.  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  Prior to this, due  to her family history, genetic testing was suggested, but declined as her sisters genetic testing was negative.  Annual lateral screening mammogram in May 2021 revealed a possible mass, as well as an area of calcifications of the right breast. Right diagnostic mammogram and ultrasound in August revealed a 0.9 cm mass in the medial right breast, as well as an indeterminate group of coarse heterogeneous calcifications measuring 0.6 cm in the upper outer right breast. 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  Estrogen and progesterone receptors were highly positive at 100%, and HER2/neu negative.  Ki 67 was 1%.  The biopsy of the area of calcifications revealed fibro-adenomatoid changes with calcifications.  She underwent testing for hereditary cancer syndromes with the [facility] Hereditary Cancer Panel test in August 2021.  This did not reveal any clinically significant mutation or variants of uncertain significance. She was treated with lumpectomy and sentinel lymph node biopsy in September. Pathology confirmed a 9 mm, grade 1, invasive ductal carcinoma, as well as low grade ductal carcinoma in situ,.  Margins were negative for malignancy. 1 sentinel lymph node was negative for metastasis.  She received adjuvant radiation therapy to the right breast. Bone density scan in November 2021 was normal.  She was placed on anastrozole 1 mg daily in November 2021.  Annual bilateral diagnostic mammograms have not revealed any evidence of malignancy.  Colonoscopy in April 2023 revealed small tubular adenomas.  In June 2023, bilateral diagnostic mammogram  was once again without evidence of malignancy.  She has mild renal insufficiency. She has had hypokalemia due to furosemide, for which she is taking potassium chloride supplement.  She has had intermittent mild elevation of the liver transaminases since 2014, felt to be most likely due to  medications and/or hepatic steatosis.    Oncology History  Malignant neoplasm of upper-inner quadrant of right female breast (HCC)  04/24/2020 Initial Diagnosis   Breast cancer, right (HCC)   05/07/2020 Cancer Staging   Staging form: Breast, AJCC 8th Edition - Clinical stage from 05/07/2020: Stage IA (cT1b, cN0(sn), cM0, G1, ER+, PR+, HER2-) - Signed by Dellia Beckwith, MD on 08/07/2020       INTERVAL HISTORY:  Laura [name] is added to the schedule today as she has been having night sweats and itching of her scalp. She is concerned as her mother had lymphoma. She states that she continues hydroxyurea 500 mg alternating with 1000 mg daily without difficulty.    She denies any changes in her breasts.  She denies fevers or chills. She reports chronic left knee pain which has improved. Her appetite is good. Her weight has increased 4 pounds over last 6 months .  She continues to follow with Dr. Lequita Halt.  Bilateral screening mammogram in June did not reveal any evidence of malignancy  REVIEW OF SYSTEMS:  Review of Systems - Oncology   VITALS:  There were no vitals taken for this visit.  Wt Readings from Last 3 Encounters:  05/20/23 211 lb 4.8 oz (95.8 kg)  11/06/22 207 lb (93.9 kg)  05/06/22 213 lb 4.8 oz (96.8 kg)    There is no height or weight on file to calculate BMI.  Performance status (ECOG): 1 - Symptomatic but completely ambulatory  PHYSICAL EXAM:  Physical Exam  LABS:      Latest Ref Rng & Units 05/20/2023   12:00 AM 11/06/2022    3:43 PM 05/06/2022   12:00 AM  CBC  WBC  7.6     7.3  7.0      Hemoglobin 12.0 - 16.0 12.2     12.1  12.2      Hematocrit 36 - 46 34     33.5  35      Platelets 150 - 400 K/uL 335     331  337         This result is from an external source.      Latest Ref Rng & Units 05/20/2023    3:40 PM 05/06/2022   12:00 AM 12/26/2021   12:00 AM  CMP  Glucose 70 - 99 mg/dL 606     BUN 8 - 23 mg/dL 41  35     29      Creatinine 0.44 - 1.00 mg/dL 3.01   1.0     0.9      Sodium 135 - 145 mmol/L 137  135     138      Potassium 3.5 - 5.1 mmol/L 3.4  3.4     3.2      Chloride 98 - 111 mmol/L 97  98     99      CO2 22 - 32 mmol/L 27  29     29       Calcium 8.9 - 10.3 mg/dL 9.7  9.8     9.9      Total Protein 6.5 - 8.1 g/dL 7.0     Total Bilirubin 0.3 - 1.2 mg/dL 0.7  Alkaline Phos 38 - 126 U/L 74  88     98      AST 15 - 41 U/L 21  32     39      ALT 0 - 44 U/L 25  30     35         This result is from an external source.     No results found for: "CEA1", "CEA" / No results found for: "CEA1", "CEA" No results found for: "PSA1" No results found for: "ZOX096" No results found for: "CAN125"  No results found for: "TOTALPROTELP", "ALBUMINELP", "A1GS", "A2GS", "BETS", "BETA2SER", "GAMS", "MSPIKE", "SPEI" No results found for: "TIBC", "FERRITIN", "IRONPCTSAT" No results found for: "LDH"  STUDIES:    Exam(s): [id number] MAM/MAM DIGITAL W/TOMO DIAG B  CLINICAL DATA: 72 year old female presents for annual follow-up.  History of RIGHT breast cancer and lumpectomy in 2021.  EXAM:  DIGITAL DIAGNOSTIC BILATERAL MAMMOGRAM WITH TOMOSYNTHESIS  TECHNIQUE:  Bilateral digital diagnostic mammography and breast tomosynthesis  was performed.  COMPARISON: Previous exam(s).  ACR Breast Density Category b: There are scattered areas of  fibroglandular density.  FINDINGS:  2D and 3D full field views of both breasts and a magnification view  of the lumpectomy site demonstrate no suspicious mass, nonsurgical  distortion or worrisome calcifications.  RIGHT lumpectomy changes are again noted.  IMPRESSION:  No evidence of breast malignancy.  RECOMMENDATION:  Per protocol, as the patient is now 2 or more years status post  lumpectomy, she may return to annual screening mammography in 1  year. However, given the history of breast cancer, the patient  remains eligible for annual diagnostic mammography if preferred.    HISTORY:   Past Medical History:   Diagnosis Date   Anemia due to antineoplastic chemotherapy 08/08/2021   Breast cancer (HCC) 05/02/2020   Hypertension    Osteoarthritis     Past Surgical History:  Procedure Laterality Date   APPENDECTOMY     BREAST LUMPECTOMY Right 06/10/2020   REPLACEMENT TOTAL KNEE Right    TUBAL LIGATION Bilateral    UMBILICAL HERNIA REPAIR      Family History  Problem Relation Age of Onset   Breast cancer Mother    Skin cancer Mother    Uterine cancer Mother    Lymphoma Mother     Social History:  reports that she has never smoked. She has never used smokeless tobacco. She reports that she does not drink alcohol and does not use drugs.The patient is alone today.  Allergies:  Allergies  Allergen Reactions   Morphine Nausea And Vomiting, Nausea Only and Other (See Comments)    Current Medications: Current Outpatient Medications  Medication Sig Dispense Refill   anastrozole (ARIMIDEX) 1 MG tablet Take 1 tablet by mouth once daily 90 tablet 30   aspirin 325 MG tablet Take 325 mg by mouth daily. 2 tabs daily per patient     benazepril-hydrochlorthiazide (LOTENSIN HCT) 20-25 MG tablet Take 1 tablet by mouth daily.     carvedilol (COREG) 12.5 MG tablet Take 12.5 mg by mouth 2 (two) times daily.     cetirizine (ZYRTEC) 10 MG tablet 10 mg daily.     furosemide (LASIX) 40 MG tablet Take 40 mg by mouth daily.     glucose blood (PRECISION QID TEST) test strip 1 strip by miscellaneous route 2 (two) times a day.     hydroxyurea (HYDREA) 500 MG capsule Take 1 capsule by mouth twice daily (Patient taking differently: Take  500 mg by mouth 2 (two) times daily. Patient reports taking 1 tab every other day and 2 tablets on in between days) 180 capsule 1   metFORMIN (GLUCOPHAGE) 1000 MG tablet Take 500 mg by mouth 2 (two) times daily. Per patient PCP lowered dose to 500mg  BID. 11/06/20     Multiple Vitamin (MULTIVITAMIN) tablet Take 1 tablet by mouth daily. (Patient not taking: Reported on 05/20/2023)      omega-3 fish oil (MAXEPA) 1000 MG CAPS capsule Take 2 tablets by mouth daily.     potassium chloride SA (KLOR-CON M20) 20 MEQ tablet Take 20 mEq by mouth daily. Patients reports taking 4 tabs daily     simvastatin (ZOCOR) 40 MG tablet Take 40 mg by mouth at bedtime.     No current facility-administered medications for this visit.     ASSESSMENT & PLAN:   Assessment & Plan:   1.  Essential thrombocythemia.  Her blood counts remain normal.  She will continue hydroxyurea 500 mg daily alternating with the 1000 mg daily.  2.  Stage IA hormone right breast diagnosed in August 2021.  She remains without evidence of recurrence.  She continues to follow with Dr. Lequita Halt.  Mammogram from June did not evidence of malignancy.  She knows to continue anastrozole daily for at least a total of 5 years.  3.  Borderline hypokalemia despite potassium chloride 20 mEq 2 tablets twice daily.  We will fax her labs to Dr. [name].  4.  Worsening renal function.  We asked her to see Dr. [name] regarding this.  5.  Tubular adenomas.  She will follow-up with Dr. Jennye Boroughs as recommended.   The patient understands the plans discussed today and is in agreement with them.  She knows to contact our office if she develops concerns prior to her next appointment.     I provided 30 minutes of face-to-face time during this encounter and > 50% was spent counseling as documented under my assessment and plan.    Adah Perl, PA-C  [facility] [address] [place] [address] Dept: [phone] Dept Fax: [phone]   No orders of the defined types were placed in this encounter.

## 2023-06-30 ENCOUNTER — Telehealth: Payer: Self-pay | Admitting: Oncology

## 2023-06-30 ENCOUNTER — Inpatient Hospital Stay: Payer: Medicare Other

## 2023-06-30 ENCOUNTER — Encounter: Payer: Self-pay | Admitting: Hematology and Oncology

## 2023-06-30 ENCOUNTER — Inpatient Hospital Stay: Payer: Medicare Other | Attending: Hematology and Oncology | Admitting: Hematology and Oncology

## 2023-06-30 VITALS — BP 129/65 | HR 75 | Temp 99.2°F | Resp 16 | Ht 61.0 in | Wt 209.2 lb

## 2023-06-30 DIAGNOSIS — R7401 Elevation of levels of liver transaminase levels: Secondary | ICD-10-CM | POA: Diagnosis not present

## 2023-06-30 DIAGNOSIS — D473 Essential (hemorrhagic) thrombocythemia: Secondary | ICD-10-CM | POA: Insufficient documentation

## 2023-06-30 DIAGNOSIS — I10 Essential (primary) hypertension: Secondary | ICD-10-CM | POA: Insufficient documentation

## 2023-06-30 DIAGNOSIS — R7989 Other specified abnormal findings of blood chemistry: Secondary | ICD-10-CM | POA: Diagnosis not present

## 2023-06-30 DIAGNOSIS — R61 Generalized hyperhidrosis: Secondary | ICD-10-CM | POA: Insufficient documentation

## 2023-06-30 LAB — HEPATIC FUNCTION PANEL
ALT: 31 U/L (ref 7–35)
AST: 80 — AB (ref 13–35)
Alkaline Phosphatase: 98 (ref 25–125)
Bilirubin, Total: 0.9

## 2023-06-30 LAB — HEPATITIS PANEL, ACUTE
HCV Ab: NONREACTIVE
Hep A IgM: NONREACTIVE
Hep B C IgM: NONREACTIVE
Hepatitis B Surface Ag: NONREACTIVE

## 2023-06-30 LAB — SEDIMENTATION RATE: Sed Rate: 59 mm/h — ABNORMAL HIGH (ref 0–22)

## 2023-06-30 LAB — CBC AND DIFFERENTIAL
HCT: 37 (ref 36–46)
Hemoglobin: 13.5 (ref 12.0–16.0)
Neutrophils Absolute: 2.86
Platelets: 352 10*3/uL (ref 150–400)
WBC: 5.5

## 2023-06-30 LAB — BASIC METABOLIC PANEL
BUN: 30 — AB (ref 4–21)
CO2: 29 — AB (ref 13–22)
Chloride: 99 (ref 99–108)
Creatinine: 0.9 (ref 0.5–1.1)
Glucose: 197
Potassium: 4 meq/L (ref 3.5–5.1)
Sodium: 135 — AB (ref 137–147)

## 2023-06-30 LAB — COMPREHENSIVE METABOLIC PANEL
Albumin: 4.4 (ref 3.5–5.0)
Calcium: 10.1 (ref 8.7–10.7)

## 2023-06-30 LAB — CBC: RBC: 3.4 — AB (ref 3.87–5.11)

## 2023-06-30 LAB — LACTATE DEHYDROGENASE: LDH: 185 U/L (ref 98–192)

## 2023-06-30 LAB — TSH: TSH: 4.945 u[IU]/mL — ABNORMAL HIGH (ref 0.350–4.500)

## 2023-06-30 NOTE — Telephone Encounter (Signed)
Patient has been scheduled. Aware of appt date and time.   Message Header  Department: [facility] [[id number]]   Scheduling Message Entered by Belva Crome A on 06/30/2023 at  4:31 PM Priority: Routine <No visit type provided>  Department: [facility]  Provider:  Scheduling Notes:  Labs and f/u with Dr. Shyrl Numbers in 1 mont

## 2023-06-30 NOTE — Assessment & Plan Note (Deleted)
Night sweats of uncertain etiology.  Also with pruritus of her occipital area.  As these are non-drenching, there is less concern for lymphoma.  She does not have palpable adenopathy.  It is possible that her blood sugar drops causing the sweating.  We will check a LDH, sed rate, TSH for further evaluation.  We will have her monitor her symptoms, as well as check her temperature and her blood sugar. If the night sweats become drenching, the pruritus comes generalized, or she develops other signs or symptoms, will contact us.  Will otherwise plan to see her back in 1 month with a CBC and comprehensive metabolic panel for repeat clinical assessment.

## 2023-07-06 ENCOUNTER — Encounter: Payer: Self-pay | Admitting: Hematology and Oncology

## 2023-07-27 NOTE — Progress Notes (Signed)
[facility]  [address] [place],  [place]  [address] [phone]  Clinic Day:  07/30/2023  Referring physician: Hadley Pen, MD   CHIEF COMPLAINT:  CC: Essential thrombocythemia  Current Treatment:  Hydroxyurea 500 mg daily alternating with 1000 mg daily  HISTORY OF PRESENT ILLNESS:  Laura [name] is a 72 y.o. 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  She was occasionally treated with post busulfan, but then lost to follow-up in 2011.  She was referred back in March 2014 when her platelet count reached 1.4 million.  She was treated with hydroxyurea 1000 mg daily she was lost to follow-up again from May 2017 to July 2018.  She continued hydroxyurea during that time. She had worsening thrombocythemia, so she had increased the hydroxyurea 1000 mg daily alternating with 1500 mg daily.  In October 2018, the platelet count was down to 337,000, so we decreased the hydroxyurea to a 1000 mg daily.  Her platelet count was up to 533,000 in January 2019 at Dr. [name] office, so the hydroxyurea was increased to 1500 mg daily. When she was seen for routine follow-up in October 2019 she had anemia and her platelet count had dropped to 154,000.  Hydroxyurea was placed on hold and we followed her CBC closely.  The anemia slowly resolved and platelet count slowly increased and was up to 568,000 in January 2020.  At that time, we resumed hydroxyurea 500 mg daily.  In February 2020, her platelets remained elevated at 743,000, so hydroxyurea was increased to 1000 mg, with good control of her thrombocythemia.  In November 2022, her platelet count was under 200, so hydroxyurea was decreased to 500 mg daily alternating with the 1000 mg daily, again with good control of her thrombocythemia.   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  Prior to this, due to her  family history, genetic testing was suggested, but declined as her sisters genetic testing was negative.  Annual lateral screening mammogram in May 2021 revealed a possible mass, as well as an area of calcifications of the right breast. Right diagnostic mammogram and ultrasound in August revealed a 0.9 cm mass in the medial right breast, as well as an indeterminate group of coarse heterogeneous calcifications measuring 0.6 cm in the upper outer right breast. 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  Estrogen and progesterone receptors were highly positive at 100%, and HER2/neu negative.  Ki 67 was 1%.  The biopsy of the area of calcifications revealed fibro-adenomatoid changes with calcifications.  She underwent testing for hereditary cancer syndromes with the [facility] Hereditary Cancer Panel test in August 2021.  This did not reveal any clinically significant mutation or variants of uncertain significance. She was treated with lumpectomy and sentinel lymph node biopsy in September. Pathology confirmed a 9 mm, grade 1, invasive ductal carcinoma, as well as low grade ductal carcinoma in situ,.  Margins were negative for malignancy. 1 sentinel lymph node was negative for metastasis.  She received adjuvant radiation therapy to the right breast. Bone density scan in November 2021 was normal.  She was placed on anastrozole 1 mg daily in November 2021.  Annual bilateral diagnostic mammograms have not revealed any evidence of malignancy.  Colonoscopy in April 2023 revealed small tubular adenomas.  In June 2023, bilateral  diagnostic mammogram was once again without evidence of malignancy.   She has mild renal insufficiency. She has had hypokalemia due to furosemide, for which she is taking potassium chloride supplement.  She has had intermittent mild elevation of the liver transaminases since 2014, felt to be most likely due to  medications and/or hepatic steatosis.  She was seen off schedule last month due to report of nondrenching night sweats and pruritus in the occipital area.  LDH was normal.  Sed rate was mildly elevated.  TSH was borderline elevated 4.945.  Oncology History  Malignant neoplasm of upper-inner quadrant of right female breast (HCC)  04/24/2020 Initial Diagnosis   Breast cancer, right (HCC)   05/07/2020 Cancer Staging   Staging form: Breast, AJCC 8th Edition - Clinical stage from 05/07/2020: Stage IA (cT1b, cN0(sn), cM0, G1, ER+, PR+, HER2-) - Signed by Dellia Beckwith, MD on 08/07/2020       INTERVAL HISTORY:  Laura [name] is here today for repeat clinical assessment.  She continues to have occasional mild night sweats.  She denies other symptoms such as fevers, chills, fatigue, appetite changes or unintentional weight loss.  She states the itching of her scalp turned out to be head lice, which has been treated.  She denies pain.  Her weight has increased 6 pounds over last month . She continues to work at hospice.  She also cares for her husband with dementia, which has been stable.  REVIEW OF SYSTEMS:  Review of Systems  Constitutional:  Negative for appetite change, chills, fatigue, fever and unexpected weight change.  HENT:   Negative for lump/mass, mouth sores and sore throat.   Respiratory:  Negative for cough and shortness of breath.   Cardiovascular:  Negative for chest pain and leg swelling.  Gastrointestinal:  Negative for abdominal pain, constipation, diarrhea, nausea and vomiting.  Endocrine: Negative for hot flashes.  Genitourinary:  Negative for difficulty urinating, dysuria, frequency and hematuria.   Musculoskeletal:  Negative for arthralgias, back pain and myalgias.  Skin:  Negative for rash.  Neurological:  Negative for dizziness and headaches.  Hematological:  Negative for adenopathy. Does not bruise/bleed easily.  Psychiatric/Behavioral:  Negative for depression and sleep  disturbance. The patient is not nervous/anxious.      VITALS:  Blood pressure 133/62, pulse 73, temperature 97.7 F (36.5 C), temperature source Oral, resp. rate 20, height 5\' 1"  (1.549 m), weight 215 lb (97.5 kg), SpO2 99%.  Wt Readings from Last 3 Encounters:  07/30/23 215 lb (97.5 kg)  06/30/23 209 lb 3.2 oz (94.9 kg)  05/20/23 211 lb 4.8 oz (95.8 kg)    Body mass index is 40.62 kg/m.  Performance status (ECOG): 0 - Asymptomatic  PHYSICAL EXAM:  Physical Exam Vitals and nursing note reviewed.  Constitutional:      General: She is not in acute distress.    Appearance: Normal appearance.  HENT:     Head: Normocephalic and atraumatic.     Mouth/Throat:     Mouth: Mucous membranes are moist.     Pharynx: Oropharynx is clear. No oropharyngeal exudate or posterior oropharyngeal erythema.  Eyes:     General: No scleral icterus.    Extraocular Movements: Extraocular movements intact.     Conjunctiva/sclera: Conjunctivae normal.     Pupils: Pupils are equal, round, and reactive to light.  Cardiovascular:     Rate and Rhythm: Normal rate and regular rhythm.     Heart sounds: Normal heart sounds. No murmur heard.    No  friction rub. No gallop.  Pulmonary:     Effort: Pulmonary effort is normal.     Breath sounds: Normal breath sounds. No wheezing, rhonchi or rales.  Chest:     Comments: Breast exam is deferred. Abdominal:     General: There is no distension.     Palpations: Abdomen is soft. There is no hepatomegaly, splenomegaly or mass.     Tenderness: There is no abdominal tenderness.  Musculoskeletal:        General: Normal range of motion.     Cervical back: Normal range of motion and neck supple. No tenderness.     Right lower leg: No edema.     Left lower leg: No edema.  Lymphadenopathy:     Cervical: No cervical adenopathy.     Upper Body:     Right upper body: No supraclavicular or axillary adenopathy.     Left upper body: No supraclavicular or axillary  adenopathy.     Lower Body: No right inguinal adenopathy. No left inguinal adenopathy.  Skin:    General: Skin is warm and dry.     Coloration: Skin is not jaundiced.     Findings: No rash.  Neurological:     Mental Status: She is alert and oriented to person, place, and time.     Cranial Nerves: No cranial nerve deficit.  Psychiatric:        Mood and Affect: Mood normal.        Behavior: Behavior normal.        Thought Content: Thought content normal.     LABS:      Latest Ref Rng & Units 07/30/2023    8:27 AM 06/30/2023   12:00 AM 05/20/2023   12:00 AM  CBC  WBC 4.0 - 10.5 K/uL 4.6  5.5     7.6      Hemoglobin 12.0 - 15.0 g/dL 16.1  09.6     04.5      Hematocrit 36.0 - 46.0 % 33.3  37     34      Platelets 150 - 400 K/uL 249  352     335         This result is from an external source.      Latest Ref Rng & Units 07/30/2023    8:27 AM 06/30/2023   12:00 AM 05/20/2023    3:40 PM  CMP  Glucose 70 - 99 mg/dL 409   811   BUN 8 - 23 mg/dL 30  30     41   Creatinine 0.44 - 1.00 mg/dL 9.14  0.9     7.82   Sodium 135 - 145 mmol/L 137  135     137   Potassium 3.5 - 5.1 mmol/L 4.2  4.0     3.4   Chloride 98 - 111 mmol/L 101  99     97   CO2 22 - 32 mmol/L 26  29     27    Calcium 8.9 - 10.3 mg/dL 9.8  95.6     9.7   Total Protein 6.5 - 8.1 g/dL 6.6   7.0   Total Bilirubin <1.2 mg/dL 0.5   0.7   Alkaline Phos 38 - 126 U/L 84  98     74   AST 15 - 41 U/L 23  80     21   ALT 0 - 44 U/L 22  31     25  This result is from an external source.     No results found for: "CEA1", "CEA" / No results found for: "CEA1", "CEA" No results found for: "PSA1" No results found for: "XBJ478" No results found for: "CAN125"  No results found for: "TOTALPROTELP", "ALBUMINELP", "A1GS", "A2GS", "BETS", "BETA2SER", "GAMS", "MSPIKE", "SPEI" No results found for: "TIBC", "FERRITIN", "IRONPCTSAT" Lab Results  Component Value Date   LDH 185 06/30/2023    STUDIES:  No results found.     HISTORY:   Past Medical History:  Diagnosis Date   Anemia due to antineoplastic chemotherapy 08/08/2021   Breast cancer (HCC) 05/02/2020   Hypertension    Osteoarthritis     Past Surgical History:  Procedure Laterality Date   APPENDECTOMY     BREAST LUMPECTOMY Right 06/10/2020   REPLACEMENT TOTAL KNEE Right    TUBAL LIGATION Bilateral    UMBILICAL HERNIA REPAIR      Family History  Problem Relation Age of Onset   Breast cancer Mother    Skin cancer Mother    Uterine cancer Mother    Lymphoma Mother     Social History:  reports that she has never smoked. She has never used smokeless tobacco. She reports that she does not drink alcohol and does not use drugs.The patient is alone today.  Allergies:  Allergies  Allergen Reactions   Morphine Nausea And Vomiting, Nausea Only and Other (See Comments)    Current Medications: Current Outpatient Medications  Medication Sig Dispense Refill   anastrozole (ARIMIDEX) 1 MG tablet Take 1 tablet by mouth once daily 90 tablet 30   aspirin 325 MG tablet Take 325 mg by mouth daily. 2 tabs daily per patient     benazepril-hydrochlorthiazide (LOTENSIN HCT) 20-25 MG tablet Take 1 tablet by mouth daily.     carvedilol (COREG) 12.5 MG tablet Take 12.5 mg by mouth 2 (two) times daily.     cetirizine (ZYRTEC) 10 MG tablet 10 mg daily.     furosemide (LASIX) 40 MG tablet Take 40 mg by mouth daily.     glucose blood (PRECISION QID TEST) test strip 1 strip by miscellaneous route 2 (two) times a day.     hydroxyurea (HYDREA) 500 MG capsule Take 1 capsule by mouth twice daily (Patient taking differently: Take 500 mg by mouth 2 (two) times daily. Patient reports taking 1 tab every other day and 2 tablets on in between days) 180 capsule 1   metFORMIN (GLUCOPHAGE) 1000 MG tablet Take 500 mg by mouth 2 (two) times daily. Per patient PCP lowered dose to 500mg  BID. 11/06/20     Multiple Vitamin (MULTIVITAMIN) tablet Take 1 tablet by mouth daily.  (Patient not taking: Reported on 05/20/2023)     omega-3 fish oil (MAXEPA) 1000 MG CAPS capsule Take 2 tablets by mouth daily.     potassium chloride SA (KLOR-CON M20) 20 MEQ tablet Take 20 mEq by mouth daily. Patients reports taking 4 tabs daily     simvastatin (ZOCOR) 40 MG tablet Take 40 mg by mouth at bedtime.     No current facility-administered medications for this visit.     ASSESSMENT & PLAN:   Assessment & Plan: Laura [name] is a 72 y.o. female   1.  Intermittent night sweats.  Certainly the anastrozole and even the metformin can cause night sweats.  There is not been any change in the frequency or severity night sweats.  She also reported severe pruritus of the occipital area, which eventually was diagnosed  as head lice.  This has resolved.   2.  Elevation of the AST, which has resolved.  She states she has been told she has fatty liver.  Testing for hepatitis was negative.  3.  Mild elevation of the TSH.  Will continue to monitor this.   4.  Essential thrombocythemia.  Her blood counts remain normal.  She will continue hydroxyurea 500 mg daily alternating with the 1000 mg daily.   5.  History of stage IA hormone right breast diagnosed in August 2021.  She was treated with lumpectomy followed by adjuvant radiation to the right breast.  She was started on anastrozole 1 mg daily in November 2021, so has 2 years remaining.  Bilateral diagnostic mammogram in June did not reveal any evidence of malignancy.  She remains without evidence of recurrence.     6.  Hypokalemia for which she continues potassium chloride 20 mEq 2 tablets twice daily.  Her potassium is normal today.   7.  Worsening renal function in September with improvement in October.  Her creatinine remains at baseline today.   8.  Tubular adenomas.  She will follow-up with Dr. Jennye Boroughs as recommended.  9.  New osteopenia likely related to anastrozole.  She has been taking vitamin D, but not calcium.  I instructed  her to start calcium daily.  Will plan to repeat a bone density scan in 1 year instead of 2, as if she can has continued worsening osteopenia, we will likely discontinue anastrozole early.  We will plan to see her in 4 months with a CBC, comprehensive metabolic panel and TSH.  The patient understands the plans discussed today and is in agreement with them.  She knows to contact our office if she develops concerns prior to her next appointment.     45 minutes was spent in patient care.  This included time spent preparing to see the patient (e.g., review of tests), obtaining and/or reviewing separately obtained history, counseling and educating the patient/family/caregiver, ordering medications, tests, or procedures; documenting clinical information in the electronic or other health record, independently interpreting results and communicating results to the patient/family/caregiver as well as coordination of care.    Adah Perl, PA-C  [facility] OF [facility] [address] [place] [address] Dept: [phone] Dept Fax: [phone]   No orders of the defined types were placed in this encounter.

## 2023-07-30 ENCOUNTER — Encounter: Payer: Self-pay | Admitting: Hematology and Oncology

## 2023-07-30 ENCOUNTER — Telehealth: Payer: Self-pay | Admitting: Hematology and Oncology

## 2023-07-30 ENCOUNTER — Inpatient Hospital Stay: Payer: Medicare Other

## 2023-07-30 ENCOUNTER — Inpatient Hospital Stay: Payer: Medicare Other | Attending: Hematology and Oncology | Admitting: Hematology and Oncology

## 2023-07-30 VITALS — BP 133/62 | HR 73 | Temp 97.7°F | Resp 20 | Ht 61.0 in | Wt 215.0 lb

## 2023-07-30 DIAGNOSIS — R7989 Other specified abnormal findings of blood chemistry: Secondary | ICD-10-CM

## 2023-07-30 DIAGNOSIS — N289 Disorder of kidney and ureter, unspecified: Secondary | ICD-10-CM | POA: Insufficient documentation

## 2023-07-30 DIAGNOSIS — D369 Benign neoplasm, unspecified site: Secondary | ICD-10-CM | POA: Diagnosis not present

## 2023-07-30 DIAGNOSIS — Z170 Estrogen receptor positive status [ER+]: Secondary | ICD-10-CM | POA: Diagnosis not present

## 2023-07-30 DIAGNOSIS — E876 Hypokalemia: Secondary | ICD-10-CM | POA: Diagnosis not present

## 2023-07-30 DIAGNOSIS — R61 Generalized hyperhidrosis: Secondary | ICD-10-CM | POA: Diagnosis not present

## 2023-07-30 DIAGNOSIS — Z79811 Long term (current) use of aromatase inhibitors: Secondary | ICD-10-CM | POA: Diagnosis not present

## 2023-07-30 DIAGNOSIS — Z808 Family history of malignant neoplasm of other organs or systems: Secondary | ICD-10-CM | POA: Insufficient documentation

## 2023-07-30 DIAGNOSIS — M8580 Other specified disorders of bone density and structure, unspecified site: Secondary | ICD-10-CM | POA: Insufficient documentation

## 2023-07-30 DIAGNOSIS — D473 Essential (hemorrhagic) thrombocythemia: Secondary | ICD-10-CM

## 2023-07-30 DIAGNOSIS — C50211 Malignant neoplasm of upper-inner quadrant of right female breast: Secondary | ICD-10-CM | POA: Insufficient documentation

## 2023-07-30 DIAGNOSIS — Z806 Family history of leukemia: Secondary | ICD-10-CM | POA: Diagnosis not present

## 2023-07-30 DIAGNOSIS — Z79899 Other long term (current) drug therapy: Secondary | ICD-10-CM | POA: Diagnosis not present

## 2023-07-30 LAB — CMP (CANCER CENTER ONLY)
ALT: 22 U/L (ref 0–44)
AST: 23 U/L (ref 15–41)
Albumin: 4.4 g/dL (ref 3.5–5.0)
Alkaline Phosphatase: 84 U/L (ref 38–126)
Anion gap: 11 (ref 5–15)
BUN: 30 mg/dL — ABNORMAL HIGH (ref 8–23)
CO2: 26 mmol/L (ref 22–32)
Calcium: 9.8 mg/dL (ref 8.9–10.3)
Chloride: 101 mmol/L (ref 98–111)
Creatinine: 0.97 mg/dL (ref 0.44–1.00)
GFR, Estimated: >60 mL/min (ref >60)
Glucose, Bld: 183 mg/dL — ABNORMAL HIGH (ref 70–99)
Potassium: 4.2 mmol/L (ref 3.5–5.1)
Sodium: 137 mmol/L (ref 135–145)
Total Bilirubin: 0.5 mg/dL (ref <1.2)
Total Protein: 6.6 g/dL (ref 6.5–8.1)

## 2023-07-30 LAB — CBC WITH DIFFERENTIAL (CANCER CENTER ONLY)
Abs Immature Granulocytes: 0.04 10*3/uL (ref 0.00–0.07)
Basophils Absolute: 0 10*3/uL (ref 0.0–0.1)
Basophils Relative: 1 %
Eosinophils Absolute: 0.3 10*3/uL (ref 0.0–0.5)
Eosinophils Relative: 6 %
HCT: 33.3 % — ABNORMAL LOW (ref 36.0–46.0)
Hemoglobin: 12.1 g/dL (ref 12.0–15.0)
Immature Granulocytes: 1 %
Lymphocytes Relative: 32 %
Lymphs Abs: 1.5 10*3/uL (ref 0.7–4.0)
MCH: 38.8 pg — ABNORMAL HIGH (ref 26.0–34.0)
MCHC: 36.3 g/dL — ABNORMAL HIGH (ref 30.0–36.0)
MCV: 106.7 fL — ABNORMAL HIGH (ref 80.0–100.0)
Monocytes Absolute: 0.6 10*3/uL (ref 0.1–1.0)
Monocytes Relative: 14 %
Neutro Abs: 2.1 10*3/uL (ref 1.7–7.7)
Neutrophils Relative %: 46 %
Platelet Count: 249 10*3/uL (ref 150–400)
RBC: 3.12 MIL/uL — ABNORMAL LOW (ref 3.87–5.11)
RDW: 14.5 % (ref 11.5–15.5)
WBC Count: 4.6 10*3/uL (ref 4.0–10.5)
nRBC: 0 % (ref 0.0–0.2)
nRBC: 0 /100{WBCs}

## 2023-07-30 NOTE — Telephone Encounter (Signed)
Patient has been scheduled for follow-up visit per 07/30/23 LOS.  Pt given an appt calendar with date and time.

## 2023-07-31 LAB — T3 UPTAKE: T3 Uptake Ratio: 22 % — ABNORMAL LOW (ref 24–39)

## 2023-07-31 LAB — T4: T4, Total: 9.1 ug/dL (ref 4.5–12.0)

## 2023-09-16 ENCOUNTER — Other Ambulatory Visit: Payer: Self-pay | Admitting: Oncology

## 2023-11-17 ENCOUNTER — Other Ambulatory Visit: Payer: Self-pay | Admitting: Oncology

## 2023-11-17 DIAGNOSIS — D473 Essential (hemorrhagic) thrombocythemia: Secondary | ICD-10-CM

## 2023-11-18 ENCOUNTER — Inpatient Hospital Stay: Payer: No Typology Code available for payment source

## 2023-11-18 ENCOUNTER — Inpatient Hospital Stay: Payer: No Typology Code available for payment source | Admitting: Oncology

## 2023-11-18 DIAGNOSIS — D473 Essential (hemorrhagic) thrombocythemia: Secondary | ICD-10-CM

## 2023-11-18 DIAGNOSIS — Z170 Estrogen receptor positive status [ER+]: Secondary | ICD-10-CM

## 2023-11-25 NOTE — Progress Notes (Signed)
 [facility]  [address] [place],  [place]  [address] [phone]  Clinic Day: 11/26/2023  Referring physician: Hadley Pen, MD  CHIEF COMPLAINT:  CC: Essential thrombocythemia  Current Treatment:  Hydroxyurea 500 mg daily alternating with 1000 mg daily  HISTORY OF PRESENT ILLNESS:  Laura [name] is a 73 y.o. 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  She was occasionally treated with post busulfan, but then lost to follow-up in 2011.  She was referred back in March 2014 when her platelet count reached 1.4 million.  She was treated with hydroxyurea 1000 mg daily she was lost to follow-up again from May 2017 to July 2018.  She continued hydroxyurea during that time. She had worsening thrombocythemia, so she had increased the hydroxyurea 1000 mg daily alternating with 1500 mg daily.  In October 2018, the platelet count was down to 337,000, so we decreased the hydroxyurea to a 1000 mg daily.  Her platelet count was up to 533,000 in January 2019 at Dr. [name] office, so the hydroxyurea was increased to 1500 mg daily. When she was seen for routine follow-up in October 2019 she had anemia and her platelet count had dropped to 154,000.  Hydroxyurea was placed on hold and we followed her CBC closely.  The anemia slowly resolved and platelet count slowly increased and was up to 568,000 in January 2020.  At that time, we resumed hydroxyurea 500 mg daily.  In February 2020, her platelets remained elevated at 743,000, so hydroxyurea was increased to 1000 mg, with good control of her thrombocythemia.  In November 2022, her platelet count was under 200, so hydroxyurea was decreased to 500 mg daily alternating with the 1000 mg daily, again with good control of her thrombocythemia.   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  Prior to this, due to her family history,  genetic testing was suggested, but declined as her sisters genetic testing was negative.  Annual lateral screening mammogram in May 2021 revealed a possible mass, as well as an area of calcifications of the right breast. Right diagnostic mammogram and ultrasound in August revealed a 0.9 cm mass in the medial right breast, as well as an indeterminate group of coarse heterogeneous calcifications measuring 0.6 cm in the upper outer right breast. 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  Estrogen and progesterone receptors were highly positive at 100%, and HER2/neu negative.  Ki 67 was 1%.  The biopsy of the area of calcifications revealed fibro-adenomatoid changes with calcifications.  She underwent testing for hereditary cancer syndromes with the [facility] Hereditary Cancer Panel test in August 2021.  This did not reveal any clinically significant mutation or variants of uncertain significance. She was treated with lumpectomy and sentinel lymph node biopsy in September. Pathology confirmed a 9 mm, grade 1, invasive ductal carcinoma, as well as low grade ductal carcinoma in situ,.  Margins were negative for malignancy. 1 sentinel lymph node was negative for metastasis.  She received adjuvant radiation therapy to the right breast. Bone density scan in November 2021 was normal.  She was placed on anastrozole 1 mg daily in November 2021.  Annual bilateral diagnostic mammograms have not revealed any evidence of malignancy.  Colonoscopy in April 2023 revealed small tubular adenomas.  In June 2023, bilateral diagnostic mammogram was  once again without evidence of malignancy.   She has mild renal insufficiency. She has had hypokalemia due to furosemide, for which she is taking potassium chloride supplement.  She has had intermittent mild elevation of the liver transaminases since 2014, felt to be most likely due to medications and/or  hepatic steatosis.  She was seen off schedule last month due to report of nondrenching night sweats and pruritus in the occipital area.  LDH was normal.  Sed rate was mildly elevated.  TSH was borderline elevated 4.945.  Oncology History  Malignant neoplasm of upper-inner quadrant of right female breast (HCC)  04/24/2020 Initial Diagnosis   Breast cancer, right (HCC)   05/07/2020 Cancer Staging   Staging form: Breast, AJCC 8th Edition - Clinical stage from 05/07/2020: Stage IA (cT1b, cN0(sn), cM0, G1, ER+, PR+, HER2-) - Signed by Dellia Beckwith, MD on 08/07/2020     INTERVAL HISTORY:  Laura [name] is here today for repeat clinical assessment for essential thrombocythemia. Patient states that she feels well and no complaints of pain. She had a diagnostic bilateral mammogram done on 03/10/2023 that was clear. She informed me that she had a cholecystectomy done last month by Dr. Lequita Halt. She continues Anastrozole 1 mg once daily and Hydrea alternating 500 mg 1 tablet one day and 2 tablets the next day, without significant difficulty. She has a WBC of 6.1, low hemoglobin of 11.7 down from 12.1, and platelet count 242,000. Her CMP is normal other than an elevated creatinine of 1.08, improved with BUN of 35 and non-fasting glucose of 294. She informed me that she did not take her glipizide 10 mg today. She had a repeat bone density scan done on 06/17/2023 which revealed worsening osteopenia of the left forearm with a T-score of -1.9, previously -1.0 and left femur neck with a T-score of -1.3 previously -0.4. I instructed her to continue oral calcium and vitamin D supplements daily. We may want to consider rechecking the bone density at one year to see if we would want to stop her Anastrozole early. I will see her back in 6 months with CBC and CMP.   She denies signs of infection such as sore throat, sinus drainage, cough, or urinary symptoms.  She denies fevers or recurrent chills. She denies nausea, vomiting,  chest pain, dyspnea or cough. Her appetite is good and her weight has decreased 5 pounds over last 4 months .   REVIEW OF SYSTEMS:  Review of Systems  Constitutional: Negative.  Negative for appetite change, chills, diaphoresis, fatigue, fever and unexpected weight change.  HENT:  Negative.  Negative for hearing loss, lump/mass, mouth sores, nosebleeds, sore throat, tinnitus, trouble swallowing and voice change.   Eyes: Negative.  Negative for eye problems and icterus.  Respiratory: Negative.  Negative for chest tightness, cough, hemoptysis, shortness of breath and wheezing.   Cardiovascular: Negative.  Negative for chest pain, leg swelling and palpitations.  Gastrointestinal: Negative.  Negative for abdominal distention, abdominal pain, blood in stool, constipation, diarrhea, nausea, rectal pain and vomiting.  Endocrine: Negative.  Negative for hot flashes.  Genitourinary: Negative.  Negative for bladder incontinence, difficulty urinating, dyspareunia, dysuria, frequency, hematuria, menstrual problem, nocturia, pelvic pain, vaginal bleeding and vaginal discharge.   Musculoskeletal:  Positive for arthralgias (left knee). Negative for back pain, flank pain, gait problem, myalgias, neck pain and neck stiffness.  Skin: Negative.  Negative for itching, rash and wound.  Neurological:  Negative for dizziness, extremity weakness, gait problem, headaches, light-headedness, numbness, seizures and speech  difficulty.  Hematological: Negative.  Negative for adenopathy. Does not bruise/bleed easily.  Psychiatric/Behavioral: Negative.  Negative for confusion, decreased concentration, depression, sleep disturbance and suicidal ideas. The patient is not nervous/anxious.      VITALS:  Blood pressure 127/65, pulse 67, temperature 98 F (36.7 C), temperature source Oral, resp. rate 16, height 5\' 1"  (1.549 m), weight 210 lb 14.4 oz (95.7 kg), SpO2 100%.  Wt Readings from Last 3 Encounters:  11/26/23 210 lb 14.4  oz (95.7 kg)  07/30/23 215 lb (97.5 kg)  06/30/23 209 lb 3.2 oz (94.9 kg)    Body mass index is 39.85 kg/m.  Performance status (ECOG): 0 - Asymptomatic  PHYSICAL EXAM:  Physical Exam Vitals and nursing note reviewed.  Constitutional:      General: She is not in acute distress.    Appearance: Normal appearance. She is normal weight. She is not ill-appearing, toxic-appearing or diaphoretic.  HENT:     Head: Normocephalic and atraumatic.     Right Ear: Tympanic membrane, ear canal and external ear normal. There is no impacted cerumen.     Left Ear: Tympanic membrane, ear canal and external ear normal. There is no impacted cerumen.     Nose: Nose normal. No congestion or rhinorrhea.     Mouth/Throat:     Mouth: Mucous membranes are moist.     Pharynx: Oropharynx is clear. No oropharyngeal exudate or posterior oropharyngeal erythema.  Eyes:     General: No scleral icterus.       Right eye: No discharge.        Left eye: No discharge.     Extraocular Movements: Extraocular movements intact.     Conjunctiva/sclera: Conjunctivae normal.     Pupils: Pupils are equal, round, and reactive to light.  Cardiovascular:     Rate and Rhythm: Normal rate and regular rhythm.     Pulses: Normal pulses.     Heart sounds: Normal heart sounds. No murmur heard.    No friction rub. No gallop.  Pulmonary:     Effort: Pulmonary effort is normal.     Breath sounds: Normal breath sounds. No wheezing, rhonchi or rales.  Chest:     Comments: Faded scar in the upper inner quadrant of the right breast Right axillary scar which is well healed No masses in either breast Abdominal:     General: Bowel sounds are normal. There is no distension.     Palpations: Abdomen is soft. There is no hepatomegaly, splenomegaly or mass.     Tenderness: There is no abdominal tenderness.     Comments: Tiny laparoscopic incision in the abdomen which are well healed  Musculoskeletal:        General: Normal range of  motion.     Cervical back: Normal range of motion and neck supple. No tenderness.     Right lower leg: No edema.     Left lower leg: No edema.  Lymphadenopathy:     Cervical: No cervical adenopathy.     Upper Body:     Right upper body: No supraclavicular or axillary adenopathy.     Left upper body: No supraclavicular or axillary adenopathy.     Lower Body: No right inguinal adenopathy. No left inguinal adenopathy.  Skin:    General: Skin is warm and dry.     Coloration: Skin is not jaundiced or pale.     Findings: No bruising, erythema, lesion or rash.  Neurological:     General: No focal deficit  present.     Mental Status: She is alert and oriented to person, place, and time. Mental status is at baseline.     Cranial Nerves: No cranial nerve deficit.     Sensory: No sensory deficit.     Motor: No weakness.     Coordination: Coordination normal.     Gait: Gait normal.     Deep Tendon Reflexes: Reflexes normal.  Psychiatric:        Mood and Affect: Mood normal.        Behavior: Behavior normal.        Thought Content: Thought content normal.        Judgment: Judgment normal.    LABS:      Latest Ref Rng & Units 11/26/2023    9:15 AM 07/30/2023    8:27 AM 06/30/2023   12:00 AM  CBC  WBC 4.0 - 10.5 K/uL 6.1  4.6  5.5      Hemoglobin 12.0 - 15.0 g/dL 40.9  81.1  91.4      Hematocrit 36.0 - 46.0 % 32.5  33.3  37      Platelets 150 - 400 K/uL 242  249  352         This result is from an external source.      Latest Ref Rng & Units 11/26/2023    9:15 AM 07/30/2023    8:27 AM 06/30/2023   12:00 AM  CMP  Glucose 70 - 99 mg/dL 782  956    BUN 8 - 23 mg/dL 35  30  30      Creatinine 0.44 - 1.00 mg/dL 2.13  0.86  0.9      Sodium 135 - 145 mmol/L 139  137  135      Potassium 3.5 - 5.1 mmol/L 4.1  4.2  4.0      Chloride 98 - 111 mmol/L 100  101  99      CO2 22 - 32 mmol/L 27  26  29       Calcium 8.9 - 10.3 mg/dL 9.7  9.8  57.8      Total Protein 6.5 - 8.1 g/dL 6.6  6.6    Total  Bilirubin 0.0 - 1.2 mg/dL 0.4  0.5    Alkaline Phos 38 - 126 U/L 106  84  98      AST 15 - 41 U/L 27  23  80      ALT 0 - 44 U/L 35  22  31         This result is from an external source.   No results found for: "CEA1", "CEA" / No results found for: "CEA1", "CEA" No results found for: "PSA1" No results found for: "ION629" No results found for: "CAN125"  No results found for: "TOTALPROTELP", "ALBUMINELP", "A1GS", "A2GS", "BETS", "BETA2SER", "GAMS", "MSPIKE", "SPEI" No results found for: "TIBC", "FERRITIN", "IRONPCTSAT" Lab Results  Component Value Date   LDH 185 06/30/2023    STUDIES:  No results found.    Exam: 03/10/2023 Digital Diagnostic Bilateral Mammogram with Tomosynthesis Impression: No evidence of breast malignancy.   HISTORY:   Past Medical History:  Diagnosis Date   Anemia due to antineoplastic chemotherapy 08/08/2021   Breast cancer (HCC) 05/02/2020   Hypertension    Osteoarthritis     Past Surgical History:  Procedure Laterality Date   APPENDECTOMY     BREAST LUMPECTOMY Right 06/10/2020   REPLACEMENT TOTAL KNEE Right    TUBAL LIGATION Bilateral  UMBILICAL HERNIA REPAIR      Family History  Problem Relation Age of Onset   Breast cancer Mother    Skin cancer Mother    Uterine cancer Mother    Lymphoma Mother     Social History:  reports that she has never smoked. She has never used smokeless tobacco. She reports that she does not drink alcohol and does not use drugs.The patient is alone today.  Allergies:  Allergies  Allergen Reactions   Morphine Nausea And Vomiting, Nausea Only and Other (See Comments)    Current Medications: Current Outpatient Medications  Medication Sig Dispense Refill   glipiZIDE (GLUCOTROL XL) 10 MG 24 hr tablet Take 1 tablet by mouth daily.     anastrozole (ARIMIDEX) 1 MG tablet TABLET EVERY DAY 90 tablet 3   aspirin 325 MG tablet Take 325 mg by mouth daily. 2 tabs daily per patient      benazepril-hydrochlorthiazide (LOTENSIN HCT) 20-25 MG tablet Take 1 tablet by mouth daily.     carvedilol (COREG) 12.5 MG tablet Take 12.5 mg by mouth 2 (two) times daily.     cetirizine (ZYRTEC) 10 MG tablet 10 mg daily.     furosemide (LASIX) 40 MG tablet Take 40 mg by mouth daily.     glucose blood (PRECISION QID TEST) test strip 1 strip by miscellaneous route 2 (two) times a day.     hydroxyurea (HYDREA) 500 MG capsule Take 1 capsule by mouth twice daily (Patient taking differently: Take 500 mg by mouth 2 (two) times daily. Patient reports taking 1 tab every other day and 2 tablets on in between days) 180 capsule 1   Multiple Vitamin (MULTIVITAMIN) tablet Take 1 tablet by mouth daily. (Patient not taking: Reported on 05/20/2023)     omega-3 fish oil (MAXEPA) 1000 MG CAPS capsule Take 2 tablets by mouth daily.     potassium chloride SA (KLOR-CON M20) 20 MEQ tablet Take 20 mEq by mouth daily. Patients reports taking 4 tabs daily     simvastatin (ZOCOR) 40 MG tablet Take 40 mg by mouth at bedtime.     No current facility-administered medications for this visit.   ASSESSMENT & PLAN:  Assessment: 1.  History of stage IA hormone right breast diagnosed in August 2021.  She was treated with lumpectomy followed by adjuvant radiation to the right breast.  She was started on anastrozole 1 mg daily in November 2021, so has 1.5 years remaining.  Bilateral diagnostic mammogram in June did not reveal any evidence of malignancy.  She remains without evidence of recurrence.    2.  Essential thrombocythemia.  Her blood counts remain normal.  She will continue hydroxyurea 500 mg daily alternating with the 1000 mg daily.  3.   Worsening renal function in January with improvement in February, 2025. Her creatinine is better at 1.08 today.     4.   New osteopenia likely related to anastrozole.  She was taking vitamin D, but not calcium.  She was instructed to start calcium daily in November and her level is good.   We will plan to repeat a bone density scan in 1 year instead of 2, as if she can has continued worsening osteopenia, we will likely discontinue anastrozole early.  5.  Tubular adenomas.  She will follow-up with Dr. Jennye Boroughs as recommended.  6. Status post cholecystectomy for cholelithiasis.    Plan:  She had a diagnostic bilateral mammogram done on 03/10/2023 that was clear. She informed me that she  had a cholecystectomy done last month by Dr. Lequita Halt. She continues Anastrozole 1 mg once daily and Hydrea 500 mg alternating 1 tablet one day and 2 tablets the next, without significant difficulty. She has a WBC of 6.1, low hemoglobin of 11.7 down from 12.1, and platelet count 242,000. Her CMP is normal other than an elevated creatinine of 1.08, improved with BUN of 35 and non-fasting glucose of 294. She informed me that she did not take her glipizide 10 mg today. She had a repeat bone density scan done on 06/17/2023 which revealed worsening osteopenia of the left forearm with a T-score of -1.9 previously -1.0 and left femur neck with a T-score of -1.3 previously -0.4. I instructed her to continue oral calcium and vitamin D supplements daily. We may want to consider rechecking the bone density at one year to see if we would want to stop her Anastrozole early. I will see her back in 6 months with CBC and CMP.   The patient understands the plans discussed today and is in agreement with them.  She knows to contact our office if she develops concerns prior to her next appointment.     I provided 18 minutes of face-to-face time during this this encounter and > 50% was spent counseling as documented under my assessment and plan.   Dellia Beckwith, MD [facility] OF [facility] [address] [place] [address] Dept: [phone] Dept Fax: [phone]   No orders of the defined types were placed in this encounter.    I,Jasmine M  Lassiter,acting as a scribe for Dellia Beckwith, MD.,have documented all relevant documentation on the behalf of Dellia Beckwith, MD,as directed by  Dellia Beckwith, MD while in the presence of Dellia Beckwith, MD.

## 2023-11-26 ENCOUNTER — Other Ambulatory Visit: Payer: Self-pay | Admitting: Oncology

## 2023-11-26 ENCOUNTER — Telehealth: Payer: Self-pay | Admitting: Oncology

## 2023-11-26 ENCOUNTER — Inpatient Hospital Stay: Payer: No Typology Code available for payment source

## 2023-11-26 ENCOUNTER — Encounter: Payer: Self-pay | Admitting: Oncology

## 2023-11-26 ENCOUNTER — Inpatient Hospital Stay: Payer: Medicare Other | Attending: Oncology | Admitting: Oncology

## 2023-11-26 VITALS — BP 127/65 | HR 67 | Temp 98.0°F | Resp 16 | Ht 61.0 in | Wt 210.9 lb

## 2023-11-26 DIAGNOSIS — D473 Essential (hemorrhagic) thrombocythemia: Secondary | ICD-10-CM

## 2023-11-26 DIAGNOSIS — Z803 Family history of malignant neoplasm of breast: Secondary | ICD-10-CM | POA: Insufficient documentation

## 2023-11-26 DIAGNOSIS — N289 Disorder of kidney and ureter, unspecified: Secondary | ICD-10-CM | POA: Insufficient documentation

## 2023-11-26 DIAGNOSIS — Z170 Estrogen receptor positive status [ER+]: Secondary | ICD-10-CM | POA: Diagnosis not present

## 2023-11-26 DIAGNOSIS — R7401 Elevation of levels of liver transaminase levels: Secondary | ICD-10-CM | POA: Diagnosis not present

## 2023-11-26 DIAGNOSIS — Z808 Family history of malignant neoplasm of other organs or systems: Secondary | ICD-10-CM | POA: Diagnosis not present

## 2023-11-26 DIAGNOSIS — Z79899 Other long term (current) drug therapy: Secondary | ICD-10-CM | POA: Insufficient documentation

## 2023-11-26 DIAGNOSIS — C50211 Malignant neoplasm of upper-inner quadrant of right female breast: Secondary | ICD-10-CM

## 2023-11-26 DIAGNOSIS — Z8049 Family history of malignant neoplasm of other genital organs: Secondary | ICD-10-CM | POA: Insufficient documentation

## 2023-11-26 DIAGNOSIS — Z807 Family history of other malignant neoplasms of lymphoid, hematopoietic and related tissues: Secondary | ICD-10-CM | POA: Insufficient documentation

## 2023-11-26 DIAGNOSIS — Z79811 Long term (current) use of aromatase inhibitors: Secondary | ICD-10-CM | POA: Diagnosis not present

## 2023-11-26 DIAGNOSIS — M8580 Other specified disorders of bone density and structure, unspecified site: Secondary | ICD-10-CM | POA: Diagnosis not present

## 2023-11-26 LAB — CMP (CANCER CENTER ONLY)
ALT: 35 U/L (ref 0–44)
AST: 27 U/L (ref 15–41)
Albumin: 4.3 g/dL (ref 3.5–5.0)
Alkaline Phosphatase: 106 U/L (ref 38–126)
Anion gap: 12 (ref 5–15)
BUN: 35 mg/dL — ABNORMAL HIGH (ref 8–23)
CO2: 27 mmol/L (ref 22–32)
Calcium: 9.7 mg/dL (ref 8.9–10.3)
Chloride: 100 mmol/L (ref 98–111)
Creatinine: 1.08 mg/dL — ABNORMAL HIGH (ref 0.44–1.00)
GFR, Estimated: 55 mL/min — ABNORMAL LOW (ref >60)
Glucose, Bld: 294 mg/dL — ABNORMAL HIGH (ref 70–99)
Potassium: 4.1 mmol/L (ref 3.5–5.1)
Sodium: 139 mmol/L (ref 135–145)
Total Bilirubin: 0.4 mg/dL (ref 0.0–1.2)
Total Protein: 6.6 g/dL (ref 6.5–8.1)

## 2023-11-26 LAB — CBC WITH DIFFERENTIAL (CANCER CENTER ONLY)
Abs Immature Granulocytes: 0.03 10*3/uL (ref 0.00–0.07)
Basophils Absolute: 0 10*3/uL (ref 0.0–0.1)
Basophils Relative: 1 %
Eosinophils Absolute: 0.7 10*3/uL — ABNORMAL HIGH (ref 0.0–0.5)
Eosinophils Relative: 11 %
HCT: 32.5 % — ABNORMAL LOW (ref 36.0–46.0)
Hemoglobin: 11.7 g/dL — ABNORMAL LOW (ref 12.0–15.0)
Immature Granulocytes: 1 %
Lymphocytes Relative: 24 %
Lymphs Abs: 1.5 10*3/uL (ref 0.7–4.0)
MCH: 38.9 pg — ABNORMAL HIGH (ref 26.0–34.0)
MCHC: 36 g/dL (ref 30.0–36.0)
MCV: 108 fL — ABNORMAL HIGH (ref 80.0–100.0)
Monocytes Absolute: 0.8 10*3/uL (ref 0.1–1.0)
Monocytes Relative: 13 %
Neutro Abs: 3.1 10*3/uL (ref 1.7–7.7)
Neutrophils Relative %: 50 %
Platelet Count: 242 10*3/uL (ref 150–400)
RBC: 3.01 MIL/uL — ABNORMAL LOW (ref 3.87–5.11)
RDW: 14.8 % (ref 11.5–15.5)
WBC Count: 6.1 10*3/uL (ref 4.0–10.5)
nRBC: 0 % (ref 0.0–0.2)
nRBC: 0 /100{WBCs}

## 2023-11-26 NOTE — Telephone Encounter (Signed)
 Patient has been scheduled for follow-up visit per 11/26/23 LOS.  Pt given an appt calendar with date and time.

## 2024-04-24 ENCOUNTER — Other Ambulatory Visit: Payer: Self-pay

## 2024-04-24 DIAGNOSIS — D473 Essential (hemorrhagic) thrombocythemia: Secondary | ICD-10-CM

## 2024-04-24 MED ORDER — HYDROXYUREA 500 MG PO CAPS: ORAL_CAPSULE | ORAL | 3 refills | Status: AC

## 2024-06-02 ENCOUNTER — Inpatient Hospital Stay

## 2024-06-02 ENCOUNTER — Inpatient Hospital Stay: Admitting: Oncology

## 2024-06-14 ENCOUNTER — Inpatient Hospital Stay

## 2024-06-14 ENCOUNTER — Inpatient Hospital Stay: Admitting: Oncology

## 2024-06-14 ENCOUNTER — Ambulatory Visit: Admitting: Oncology

## 2024-06-14 ENCOUNTER — Other Ambulatory Visit

## 2024-06-14 DIAGNOSIS — C50211 Malignant neoplasm of upper-inner quadrant of right female breast: Secondary | ICD-10-CM

## 2024-06-14 DIAGNOSIS — D473 Essential (hemorrhagic) thrombocythemia: Secondary | ICD-10-CM

## 2024-06-28 ENCOUNTER — Inpatient Hospital Stay (HOSPITAL_BASED_OUTPATIENT_CLINIC_OR_DEPARTMENT_OTHER): Admitting: Oncology

## 2024-06-28 ENCOUNTER — Inpatient Hospital Stay: Attending: Oncology

## 2024-06-28 ENCOUNTER — Other Ambulatory Visit: Payer: Self-pay | Admitting: Oncology

## 2024-06-28 ENCOUNTER — Telehealth: Payer: Self-pay | Admitting: Oncology

## 2024-06-28 VITALS — BP 141/65 | HR 84 | Temp 98.6°F | Resp 18 | Ht 61.0 in | Wt 224.0 lb

## 2024-06-28 DIAGNOSIS — M8580 Other specified disorders of bone density and structure, unspecified site: Secondary | ICD-10-CM | POA: Insufficient documentation

## 2024-06-28 DIAGNOSIS — N289 Disorder of kidney and ureter, unspecified: Secondary | ICD-10-CM | POA: Insufficient documentation

## 2024-06-28 DIAGNOSIS — Z803 Family history of malignant neoplasm of breast: Secondary | ICD-10-CM | POA: Diagnosis not present

## 2024-06-28 DIAGNOSIS — E876 Hypokalemia: Secondary | ICD-10-CM | POA: Diagnosis not present

## 2024-06-28 DIAGNOSIS — Z807 Family history of other malignant neoplasms of lymphoid, hematopoietic and related tissues: Secondary | ICD-10-CM | POA: Diagnosis not present

## 2024-06-28 DIAGNOSIS — D473 Essential (hemorrhagic) thrombocythemia: Secondary | ICD-10-CM | POA: Diagnosis present

## 2024-06-28 DIAGNOSIS — Z8049 Family history of malignant neoplasm of other genital organs: Secondary | ICD-10-CM | POA: Insufficient documentation

## 2024-06-28 DIAGNOSIS — Z79899 Other long term (current) drug therapy: Secondary | ICD-10-CM | POA: Diagnosis not present

## 2024-06-28 DIAGNOSIS — Z853 Personal history of malignant neoplasm of breast: Secondary | ICD-10-CM | POA: Diagnosis not present

## 2024-06-28 DIAGNOSIS — Z170 Estrogen receptor positive status [ER+]: Secondary | ICD-10-CM | POA: Diagnosis not present

## 2024-06-28 DIAGNOSIS — C50211 Malignant neoplasm of upper-inner quadrant of right female breast: Secondary | ICD-10-CM | POA: Diagnosis not present

## 2024-06-28 LAB — CMP (CANCER CENTER ONLY)
ALT: 35 U/L (ref 0–44)
AST: 30 U/L (ref 15–41)
Albumin: 4.4 g/dL (ref 3.5–5.0)
Alkaline Phosphatase: 117 U/L (ref 38–126)
Anion gap: 11 (ref 5–15)
BUN: 31 mg/dL — ABNORMAL HIGH (ref 8–23)
CO2: 28 mmol/L (ref 22–32)
Calcium: 10.2 mg/dL (ref 8.9–10.3)
Chloride: 98 mmol/L (ref 98–111)
Creatinine: 1.76 mg/dL — ABNORMAL HIGH (ref 0.44–1.00)
GFR, Estimated: 30 mL/min — ABNORMAL LOW (ref >60)
Glucose, Bld: 233 mg/dL — ABNORMAL HIGH (ref 70–99)
Potassium: 3.8 mmol/L (ref 3.5–5.1)
Sodium: 136 mmol/L (ref 135–145)
Total Bilirubin: 0.8 mg/dL (ref 0.0–1.2)
Total Protein: 7.1 g/dL (ref 6.5–8.1)

## 2024-06-28 LAB — CBC WITH DIFFERENTIAL (CANCER CENTER ONLY)
Abs Immature Granulocytes: 0.03 K/uL (ref 0.00–0.07)
Basophils Absolute: 0.1 K/uL (ref 0.0–0.1)
Basophils Relative: 1 %
Eosinophils Absolute: 0.4 K/uL (ref 0.0–0.5)
Eosinophils Relative: 6 %
HCT: 35.6 % — ABNORMAL LOW (ref 36.0–46.0)
Hemoglobin: 12.6 g/dL (ref 12.0–15.0)
Immature Granulocytes: 1 %
Lymphocytes Relative: 21 %
Lymphs Abs: 1.3 K/uL (ref 0.7–4.0)
MCH: 35 pg — ABNORMAL HIGH (ref 26.0–34.0)
MCHC: 35.4 g/dL (ref 30.0–36.0)
MCV: 98.9 fL (ref 80.0–100.0)
Monocytes Absolute: 0.8 K/uL (ref 0.1–1.0)
Monocytes Relative: 13 %
Neutro Abs: 3.7 K/uL (ref 1.7–7.7)
Neutrophils Relative %: 58 %
Platelet Count: 351 K/uL (ref 150–400)
RBC: 3.6 MIL/uL — ABNORMAL LOW (ref 3.87–5.11)
RDW: 20.9 % — ABNORMAL HIGH (ref 11.5–15.5)
WBC Count: 6.2 K/uL (ref 4.0–10.5)
nRBC: 0 % (ref 0.0–0.2)

## 2024-06-28 NOTE — Progress Notes (Signed)
 [facility]  [address] [place],  [place]  [address] [phone]  Clinic Day: 06/28/24  Referring physician: Silver Lamar LABOR, MD  CHIEF COMPLAINT:  CC: Essential thrombocythemia  Current Treatment:  Hydroxyurea  500 mg daily alternating with 1000 mg daily  HISTORY OF PRESENT ILLNESS:  Laura [name] is a 73 y.o. 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  She was occasionally treated with post busulfan, but then lost to follow-up in 2011.  She was referred back in March 2014 when her platelet count reached 1.4 million.  She was treated with hydroxyurea  1000 mg daily she was lost to follow-up again from May 2017 to July 2018.  She continued hydroxyurea  during that time. She had worsening thrombocythemia, so she had increased the hydroxyurea  1000 mg daily alternating with 1500 mg daily.  In October 2018, the platelet count was down to 337,000, so we decreased the hydroxyurea  to a 1000 mg daily.  Her platelet count was up to 533,000 in January 2019 at Dr. Silver office, so the hydroxyurea  was increased to 1500 mg daily. When she was seen for routine follow-up in October 2019 she had anemia and her platelet count had dropped to 154,000.  Hydroxyurea  was placed on hold and we followed her CBC closely.  The anemia slowly resolved and platelet count slowly increased and was up to 568,000 in January 2020.  At that time, we resumed hydroxyurea  500 mg daily.  In February 2020, her platelets remained elevated at 743,000, so hydroxyurea  was increased to 1000 mg, with good control of her thrombocythemia.  In November 2022, her platelet count was under 200, so hydroxyurea  was decreased to 500 mg daily alternating with the 1000 mg daily, again with good control of her thrombocythemia.   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  Prior to this, due to her family history,  genetic testing was suggested, but declined as her sisters genetic testing was negative.  Annual lateral screening mammogram in May 2021 revealed a possible mass, as well as an area of calcifications of the right breast. Right diagnostic mammogram and ultrasound in August revealed a 0.9 cm mass in the medial right breast, as well as an indeterminate group of coarse heterogeneous calcifications measuring 0.6 cm in the upper outer right breast. 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  Estrogen and progesterone receptors were highly positive at 100%, and HER2/neu negative.  Ki 67 was 1%.  The biopsy of the area of calcifications revealed fibro-adenomatoid changes with calcifications.  She underwent testing for hereditary cancer syndromes with the [facility] Hereditary Cancer Panel test in August 2021.  This did not reveal any clinically significant mutation or variants of uncertain significance. She was treated with lumpectomy and sentinel lymph node biopsy in September. Pathology confirmed a 9 mm, grade 1, invasive ductal carcinoma, as well as low grade ductal carcinoma in situ,.  Margins were negative for malignancy. 1 sentinel lymph node was negative for metastasis.  She received adjuvant radiation therapy to the right breast. Bone density scan in November 2021 was normal.  She was placed on anastrozole  1 mg daily in November 2021.  Annual bilateral diagnostic mammograms have not revealed any evidence of malignancy.  Colonoscopy in April 2023 revealed small tubular adenomas.  In June 2023, bilateral diagnostic mammogram was  once again without evidence of malignancy.   She has mild renal insufficiency. She has had hypokalemia due to furosemide, for which she is taking potassium chloride supplement.  She has had intermittent mild elevation of the liver transaminases since 2014, felt to be most likely due to medications and/or  hepatic steatosis.  She was seen off schedule last month due to report of nondrenching night sweats and pruritus in the occipital area.  LDH was normal.  Sed rate was mildly elevated.  TSH was borderline elevated 4.945.  Oncology History  Malignant neoplasm of upper-inner quadrant of right female breast (HCC)  04/24/2020 Initial Diagnosis   Breast cancer, right (HCC)   05/07/2020 Cancer Staging   Staging form: Breast, AJCC 8th Edition - Clinical stage from 05/07/2020: Stage IA (cT1b, cN0(sn), cM0, G1, ER+, PR+, HER2-) - Signed by Cornelius Wanda DEL, MD on 08/07/2020     INTERVAL HISTORY:  Laura [name] is here today for repeat clinical assessment for essential thrombocythemia and stage IA breast cancer diagnosed in 2021. Patient states that she feels well but complains of left knee pain. She continues Anastrozole  1 mg once daily and has one more year to go on this. She's on Hydrea  2 tablets alternating with 1 tablet daily. She tolerates these without difficulty. She had a screening bilateral mammogram done on 04/03/2024 that was clear and had follow-up with Dr. Joesph. She informed me that she and her PCP are currently working on finding an insulin that will manage her glucose levels, which have been out of control.  She has a WBC of 6.2, hemoglobin of 12.6, and platelet count of 351,000. Her CMP is normal other than an elevated glucose of 233 and creatinine of 1.76 with a BUN of 31. I will see her back in 6 months with CBC and CMP.  She denies fever, chills, night sweats, or other signs of infection. She denies cardiorespiratory and gastrointestinal issues. Her appetite is wonderful and Her weight has decreased 14 pounds over last 7 months.   REVIEW OF SYSTEMS:  Review of Systems  Constitutional: Negative.  Negative for appetite change, chills, diaphoresis, fatigue, fever and unexpected weight change.  HENT:  Negative.  Negative for hearing loss, lump/mass, mouth sores, nosebleeds, sore throat, tinnitus,  trouble swallowing and voice change.   Eyes: Negative.  Negative for eye problems and icterus.  Respiratory: Negative.  Negative for chest tightness, cough, hemoptysis, shortness of breath and wheezing.   Cardiovascular: Negative.  Negative for chest pain, leg swelling and palpitations.  Gastrointestinal: Negative.  Negative for abdominal distention, abdominal pain, blood in stool, constipation, diarrhea, nausea, rectal pain and vomiting.  Endocrine: Negative.  Negative for hot flashes.  Genitourinary: Negative.  Negative for bladder incontinence, difficulty urinating, dyspareunia, dysuria, frequency, hematuria, menstrual problem, nocturia, pelvic pain, vaginal bleeding and vaginal discharge.   Musculoskeletal:  Positive for arthralgias (left knee). Negative for back pain, flank pain, gait problem, myalgias, neck pain and neck stiffness.  Skin: Negative.  Negative for itching, rash and wound.  Neurological:  Negative for dizziness, extremity weakness, gait problem, headaches, light-headedness, numbness, seizures and speech difficulty.  Hematological: Negative.  Negative for adenopathy. Does not bruise/bleed easily.  Psychiatric/Behavioral: Negative.  Negative for confusion, decreased concentration, depression, sleep disturbance and suicidal ideas. The patient is not nervous/anxious.     VITALS:  Blood pressure (!) 141/65, pulse 84, temperature 98.6 F (37 C), temperature source Oral, resp. rate 18, height 5' 1 (1.549 m), weight 224 lb (101.6 kg), SpO2 96%.  Wt  Readings from Last 3 Encounters:  06/28/24 224 lb (101.6 kg)  11/26/23 210 lb 14.4 oz (95.7 kg)  07/30/23 215 lb (97.5 kg)    Body mass index is 42.32 kg/m.  Performance status (ECOG): 0 - Asymptomatic  PHYSICAL EXAM:  Physical Exam Vitals and nursing note reviewed.  Constitutional:      General: She is not in acute distress.    Appearance: Normal appearance. She is normal weight. She is not ill-appearing, toxic-appearing or  diaphoretic.  HENT:     Head: Normocephalic and atraumatic.     Right Ear: Tympanic membrane, ear canal and external ear normal. There is no impacted cerumen.     Left Ear: Tympanic membrane, ear canal and external ear normal. There is no impacted cerumen.     Nose: Nose normal. No congestion or rhinorrhea.     Mouth/Throat:     Mouth: Mucous membranes are moist.     Pharynx: Oropharynx is clear. No oropharyngeal exudate or posterior oropharyngeal erythema.  Eyes:     General: No scleral icterus.       Right eye: No discharge.        Left eye: No discharge.     Extraocular Movements: Extraocular movements intact.     Conjunctiva/sclera: Conjunctivae normal.     Pupils: Pupils are equal, round, and reactive to light.  Cardiovascular:     Rate and Rhythm: Normal rate and regular rhythm.     Pulses: Normal pulses.     Heart sounds: Normal heart sounds. No murmur heard.    No friction rub. No gallop.  Pulmonary:     Effort: Pulmonary effort is normal.     Breath sounds: Normal breath sounds. No wheezing, rhonchi or rales.  Chest:     Comments: Well healed scar in the upper outer quadrant of the right breast No masses in either breast Abdominal:     General: Bowel sounds are normal. There is no distension.     Palpations: Abdomen is soft. There is no hepatomegaly, splenomegaly or mass.     Tenderness: There is no abdominal tenderness.     Comments: Tiny laparoscopic incision in the abdomen which are well healed  Musculoskeletal:        General: Normal range of motion.     Cervical back: Normal range of motion and neck supple. No tenderness.     Right lower leg: No edema.     Left lower leg: No edema.  Lymphadenopathy:     Cervical: No cervical adenopathy.     Upper Body:     Right upper body: No supraclavicular or axillary adenopathy.     Left upper body: No supraclavicular or axillary adenopathy.     Lower Body: No right inguinal adenopathy. No left inguinal adenopathy.   Skin:    General: Skin is warm and dry.     Coloration: Skin is not jaundiced or pale.     Findings: No bruising, erythema, lesion or rash.  Neurological:     General: No focal deficit present.     Mental Status: She is alert and oriented to person, place, and time. Mental status is at baseline.     Cranial Nerves: No cranial nerve deficit.     Sensory: No sensory deficit.     Motor: No weakness.     Coordination: Coordination normal.     Gait: Gait normal.     Deep Tendon Reflexes: Reflexes normal.  Psychiatric:  Mood and Affect: Mood normal.        Behavior: Behavior normal.        Thought Content: Thought content normal.        Judgment: Judgment normal.    LABS:      Latest Ref Rng & Units 06/28/2024    3:52 PM 11/26/2023    9:15 AM 07/30/2023    8:27 AM  CBC  WBC 4.0 - 10.5 K/uL 6.2  6.1  4.6   Hemoglobin 12.0 - 15.0 g/dL 87.3  88.2  87.8   Hematocrit 36.0 - 46.0 % 35.6  32.5  33.3   Platelets 150 - 400 K/uL 351  242  249       Latest Ref Rng & Units 06/28/2024    3:52 PM 11/26/2023    9:15 AM 07/30/2023    8:27 AM  CMP  Glucose 70 - 99 mg/dL 766  705  816   BUN 8 - 23 mg/dL 31  35  30   Creatinine 0.44 - 1.00 mg/dL 8.23  8.91  9.02   Sodium 135 - 145 mmol/L 136  139  137   Potassium 3.5 - 5.1 mmol/L 3.8  4.1  4.2   Chloride 98 - 111 mmol/L 98  100  101   CO2 22 - 32 mmol/L 28  27  26    Calcium 8.9 - 10.3 mg/dL 89.7  9.7  9.8   Total Protein 6.5 - 8.1 g/dL 7.1  6.6  6.6   Total Bilirubin 0.0 - 1.2 mg/dL 0.8  0.4  0.5   Alkaline Phos 38 - 126 U/L 117  106  84   AST 15 - 41 U/L 30  27  23    ALT 0 - 44 U/L 35  35  22    No results found for: CEA1, CEA / No results found for: CEA1, CEA No results found for: PSA1 No results found for: CAN199 No results found for: CAN125  No results found for: TOTALPROTELP, ALBUMINELP, A1GS, A2GS, BETS, BETA2SER, GAMS, MSPIKE, SPEI No results found for: TIBC, FERRITIN, IRONPCTSAT Lab  Results  Component Value Date   LDH 185 06/30/2023    STUDIES:  Exam: 04/03/2024 Screening Bilateral Mammogram with Tomosynthesis Impression: No mammographic evidence of malignancy.      HISTORY:   Past Medical History:  Diagnosis Date   Anemia due to antineoplastic chemotherapy 08/08/2021   Breast cancer (HCC) 05/02/2020   Hypertension    Osteoarthritis     Past Surgical History:  Procedure Laterality Date   APPENDECTOMY     BREAST LUMPECTOMY Right 06/10/2020   REPLACEMENT TOTAL KNEE Right    TUBAL LIGATION Bilateral    UMBILICAL HERNIA REPAIR      Family History  Problem Relation Age of Onset   Breast cancer Mother    Skin cancer Mother    Uterine cancer Mother    Lymphoma Mother     Social History:  reports that she has never smoked. She has never used smokeless tobacco. She reports that she does not drink alcohol and does not use drugs.The patient is alone today.  Allergies:  Allergies  Allergen Reactions   Morphine Nausea And Vomiting, Nausea Only and Other (See Comments)    Current Medications: Current Outpatient Medications  Medication Sig Dispense Refill   hydrOXYzine (VISTARIL) 25 MG capsule Take by mouth.     Insulin Glargine (BASAGLAR KWIKPEN) 100 UNIT/ML Inject 25 Units into the skin daily.     anastrozole  (ARIMIDEX )  1 MG tablet TABLET EVERY DAY 90 tablet 3   aspirin 325 MG tablet Take 325 mg by mouth daily. 2 tabs daily per patient     benazepril-hydrochlorthiazide (LOTENSIN HCT) 20-25 MG tablet Take 1 tablet by mouth daily.     carvedilol (COREG) 12.5 MG tablet Take 12.5 mg by mouth 2 (two) times daily.     cetirizine (ZYRTEC) 10 MG tablet 10 mg daily.     furosemide (LASIX) 40 MG tablet Take 40 mg by mouth daily.     glipiZIDE (GLUCOTROL XL) 10 MG 24 hr tablet Take 1 tablet by mouth daily.     glucose blood (PRECISION QID TEST) test strip 1 strip by miscellaneous route 2 (two) times a day.     hydroxyurea  (HYDREA ) 500 MG capsule As directed  180 capsule 3   Multiple Vitamin (MULTIVITAMIN) tablet Take 1 tablet by mouth daily. (Patient not taking: Reported on 05/20/2023)     omega-3 fish oil (MAXEPA) 1000 MG CAPS capsule Take 2 tablets by mouth daily.     potassium chloride SA (KLOR-CON M20) 20 MEQ tablet Take 20 mEq by mouth daily. Patients reports taking 4 tabs daily     simvastatin (ZOCOR) 20 MG tablet Take 20 mg by mouth at bedtime.     No current facility-administered medications for this visit.   ASSESSMENT & PLAN:  Assessment: 1.  History of stage IA hormone right breast diagnosed in August 2021.  She was treated with lumpectomy followed by adjuvant radiation to the right breast.  She was started on anastrozole  1 mg daily in November 2021, so has 1 year remaining.  Bilateral screening mammogram in July, 2025 did not reveal any evidence of malignancy.  She remains without evidence of recurrence.    2.  Essential thrombocythemia.  Her blood counts remain normal.  She will continue hydroxyurea  500 mg daily alternating with the 1000 mg daily.  3.   Worsening renal function in January with improvement in February, 2025. Her creatinine was up to 1.27 last time and now is even worse at 1.76 with a BUN of 31. I encouraged her to drink fluids and she knows that a lot of this is caused by her uncontrolled diabetes.    4.   New osteopenia likely related to anastrozole .  She was taking vitamin D, but not calcium.  She was instructed to start calcium daily in November and her level is good. We will repeat a bone density in September of 2026.   5.  Tubular adenomas.  She will follow-up with Dr. Larene as recommended.  6. Status post cholecystectomy for cholelithiasis.    Plan:  She continues Anastrozole  1mg  once daily and has once more year to go on this. She's on Hydrea  2 tablets alternating with 1 tablet daily. She tolerates these without difficulty. She had a screening bilateral mammogram done on 04/03/2024 that was clear and had  follow-up with Dr. Joesph. She informed me that her and her PCP are currently working on finding an insulin that will manage her glucose levels, which have been out of control.  She has a WBC of 6.2, hemoglobin of 12.6, and platelet count of 351,000. Her CMP is normal other than an elevated glucose of 233 and creatinine of 1.76 with a BUN of 31. I will see her back in 6 months with CBC and CMP.  The patient understands the plans discussed today and is in agreement with them.  She knows to contact our office if she  develops concerns prior to her next appointment.    I provided 20 minutes of face-to-face time during this this encounter and > 50% was spent counseling as documented under my assessment and plan.   Wanda VEAR Cornish, MD [facility] A DEPT OF [facility] [address] [place] [address] Dept: [phone] Dept Fax: [phone]   No orders of the defined types were placed in this encounter.    I,Jasmine M Lassiter,acting as a scribe for Wanda VEAR Cornish, MD.,have documented all relevant documentation on the behalf of Wanda VEAR Cornish, MD,as directed by  Wanda VEAR Cornish, MD while in the presence of Wanda VEAR Cornish, MD.

## 2024-06-28 NOTE — Telephone Encounter (Signed)
 Patient has been scheduled for follow-up visit per 06/28/24 LOS.  Pt given an appt calendar with date and time.

## 2024-07-05 ENCOUNTER — Encounter: Payer: Self-pay | Admitting: Oncology

## 2024-09-17 ENCOUNTER — Other Ambulatory Visit: Payer: Self-pay | Admitting: Oncology

## 2024-12-27 ENCOUNTER — Ambulatory Visit: Admitting: Oncology

## 2024-12-27 ENCOUNTER — Other Ambulatory Visit
